# Patient Record
Sex: Female | Born: 1985
Health system: Southern US, Community
[De-identification: ages and names within clinical notes are randomized; demographics above are authoritative.]

## PROBLEM LIST (undated history)

## (undated) DIAGNOSIS — F209 Schizophrenia, unspecified: Secondary | ICD-10-CM

## (undated) DIAGNOSIS — E119 Type 2 diabetes mellitus without complications: Secondary | ICD-10-CM

## (undated) DIAGNOSIS — G4733 Obstructive sleep apnea (adult) (pediatric): Secondary | ICD-10-CM

## (undated) DIAGNOSIS — D582 Other hemoglobinopathies: Secondary | ICD-10-CM

## (undated) DIAGNOSIS — R000 Tachycardia, unspecified: Secondary | ICD-10-CM

## (undated) DIAGNOSIS — G4730 Sleep apnea, unspecified: Secondary | ICD-10-CM

## (undated) DIAGNOSIS — E669 Obesity, unspecified: Secondary | ICD-10-CM

## (undated) DIAGNOSIS — E559 Vitamin D deficiency, unspecified: Secondary | ICD-10-CM

## (undated) DIAGNOSIS — I10 Essential (primary) hypertension: Secondary | ICD-10-CM

## (undated) HISTORY — DX: Schizophrenia, unspecified: F20.9

## (undated) HISTORY — DX: Vitamin D deficiency, unspecified: E55.9

## (undated) HISTORY — DX: Other hemoglobinopathies: D58.2

## (undated) HISTORY — DX: Obstructive sleep apnea (adult) (pediatric): G47.33

## (undated) HISTORY — DX: Tachycardia, unspecified: R00.0

## (undated) HISTORY — DX: Obesity, unspecified: E66.9

## (undated) HISTORY — DX: Type 2 diabetes mellitus without complications: E11.9

---
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
## 2004-06-30 ENCOUNTER — Other Ambulatory Visit: Admission: RE | Admit: 2004-06-30 | Discharge: 2004-06-30 | Payer: Self-pay | Admitting: Gynecology

## 2006-05-06 ENCOUNTER — Other Ambulatory Visit: Admission: RE | Admit: 2006-05-06 | Discharge: 2006-05-06 | Payer: Self-pay | Admitting: Family Medicine

## 2011-03-17 ENCOUNTER — Other Ambulatory Visit (HOSPITAL_COMMUNITY)
Admission: RE | Admit: 2011-03-17 | Discharge: 2011-03-17 | Disposition: A | Discharge status: Home / Self Care | Payer: BC Managed Care – PPO | Source: Ambulatory Visit | Attending: Family Medicine | Admitting: Family Medicine

## 2011-03-17 ENCOUNTER — Other Ambulatory Visit: Payer: Self-pay | Admitting: Family Medicine

## 2011-03-17 DIAGNOSIS — Z0000 Encounter for general adult medical examination without abnormal findings: Secondary | ICD-10-CM | POA: Insufficient documentation

## 2014-02-23 ENCOUNTER — Other Ambulatory Visit (HOSPITAL_COMMUNITY)
Admission: RE | Admit: 2014-02-23 | Discharge: 2014-02-23 | Disposition: A | Discharge status: Home / Self Care | Payer: BC Managed Care – PPO | Source: Ambulatory Visit | Attending: Family Medicine | Admitting: Family Medicine

## 2014-02-23 ENCOUNTER — Other Ambulatory Visit: Payer: Self-pay | Admitting: Family Medicine

## 2014-02-23 DIAGNOSIS — Z124 Encounter for screening for malignant neoplasm of cervix: Secondary | ICD-10-CM | POA: Diagnosis present

## 2014-02-23 DIAGNOSIS — Z1151 Encounter for screening for human papillomavirus (HPV): Secondary | ICD-10-CM | POA: Diagnosis present

## 2014-02-27 LAB — CYTOLOGY - PAP

## 2015-04-30 ENCOUNTER — Ambulatory Visit (INDEPENDENT_AMBULATORY_CARE_PROVIDER_SITE_OTHER): Payer: BLUE CROSS/BLUE SHIELD

## 2015-04-30 ENCOUNTER — Ambulatory Visit (INDEPENDENT_AMBULATORY_CARE_PROVIDER_SITE_OTHER): Payer: BLUE CROSS/BLUE SHIELD | Admitting: Physician Assistant

## 2015-04-30 ENCOUNTER — Telehealth: Payer: Self-pay | Admitting: *Deleted

## 2015-04-30 ENCOUNTER — Encounter: Payer: Self-pay | Admitting: Physician Assistant

## 2015-04-30 VITALS — BP 116/88 | HR 139 | Temp 97.7°F | Resp 16 | Wt 240.8 lb

## 2015-04-30 DIAGNOSIS — M25561 Pain in right knee: Secondary | ICD-10-CM

## 2015-04-30 DIAGNOSIS — R000 Tachycardia, unspecified: Secondary | ICD-10-CM

## 2015-04-30 LAB — POCT CBC
Granulocyte percent: 60.6 %G (ref 37–80)
HEMATOCRIT: 29.8 % — AB (ref 37.7–47.9)
Hemoglobin: 10.1 g/dL — AB (ref 12.2–16.2)
LYMPH, POC: 3.5 — AB (ref 0.6–3.4)
MCH, POC: 26 pg — AB (ref 27–31.2)
MCHC: 33.9 g/dL (ref 31.8–35.4)
MCV: 76.7 fL — AB (ref 80–97)
MID (CBC): 0.5 (ref 0–0.9)
MPV: 7 fL (ref 0–99.8)
POC GRANULOCYTE: 6.1 (ref 2–6.9)
POC LYMPH %: 34.5 % (ref 10–50)
POC MID %: 4.9 % (ref 0–12)
Platelet Count, POC: 277 10*3/uL (ref 142–424)
RBC: 3.89 M/uL — AB (ref 4.04–5.48)
RDW, POC: 18.8 %
WBC: 10 10*3/uL (ref 4.6–10.2)

## 2015-04-30 LAB — D-DIMER, QUANTITATIVE (NOT AT ARMC): D DIMER QUANT: 0.37 ug{FEU}/mL (ref 0.00–0.48)

## 2015-04-30 MED ORDER — IBUPROFEN 600 MG PO TABS: 600.0000 mg | ORAL_TABLET | Freq: Three times a day (TID) | ORAL | Status: AC | PRN

## 2015-04-30 NOTE — Telephone Encounter (Signed)
Stat Report: D-Dimer-0.37

## 2015-04-30 NOTE — Progress Notes (Signed)
05/01/2015 at 10:39 AM  Chelsey Villa / DOB: [birth year] / MRN: [id number]  The patient  does not have a problem list on file.  SUBJECTIVE  Chelsey Villa is a 29 y.o. female who complains of right sided medail knee pain that started about 1 week ago.  She has just started an exercise regimen and the pain started after initiation. Walking makes the pain worse.  She is not sexually active. She has a history of refractory schizophrenia and takes Clozaril and Lurasidone.  She has a history of tachycardia.  She denies chest pain and SOB.  Tachycardia managed by her PCP Dr. Laurann Montanaynthia White.    She  has no past medical history on file.    Medications reviewed and updated by myself where necessary, and exist elsewhere in the encounter.   Chelsey Villa has No Known Allergies. She  reports that she has never smoked. She does not have any smokeless tobacco history on file. She  has no sexual activity history on file. The patient  has no past surgical history on file.  Her family history is not on file.  Review of Systems  Constitutional: Negative for fever and chills.  Respiratory: Negative for shortness of breath.   Cardiovascular: Negative for chest pain.  Gastrointestinal: Negative for nausea and abdominal pain.  Genitourinary: Negative.   Skin: Negative for rash.  Neurological: Negative for dizziness and headaches.    OBJECTIVE  Her  weight is 240 lb 12.8 oz (109.226 kg). Her oral temperature is 97.7 F (36.5 C). Her blood pressure is 116/88 and her pulse is 139. Her respiration is 16.  The patient's body mass index is unknown because there is no height on file.  Physical Exam  Constitutional: She is oriented to person, place, and time. She appears well-developed and well-nourished. No distress.  Eyes: Pupils are equal, round, and reactive to light.  Cardiovascular: Normal rate and regular rhythm.   Respiratory: Effort normal and breath sounds normal. No respiratory distress. She  has no wheezes. She exhibits no tenderness.  GI: Soft. Bowel sounds are normal. She exhibits no distension.  Musculoskeletal: Normal range of motion.       Right knee: She exhibits normal range of motion, no swelling, no effusion, no ecchymosis, no deformity, no erythema, normal alignment, no LCL laxity, normal patellar mobility, no bony tenderness, normal meniscus and no MCL laxity. Tenderness found. Medial joint line tenderness noted. No lateral joint line, no MCL, no LCL and no patellar tendon tenderness noted.       Left knee: Normal.  Neurological: She is alert and oriented to person, place, and time.  Skin: Skin is warm and dry. She is not diaphoretic.  Psychiatric: She has a normal mood and affect. Her behavior is normal. Judgment and thought content normal.    Results for orders placed or performed in visit on 04/30/15 (from the past 24 hour(s))  POCT CBC     Status: Abnormal   Collection Time: 04/30/15  3:14 PM  Result Value Ref Range   WBC 10.0 4.6 - 10.2 K/uL   Lymph, poc 3.5 (A) 0.6 - 3.4   POC LYMPH PERCENT 34.5 10 - 50 %L   MID (cbc) 0.5 0 - 0.9   POC MID % 4.9 0 - 12 %M   POC Granulocyte 6.1 2 - 6.9   Granulocyte percent 60.6 37 - 80 %G   RBC 3.89 (A) 4.04 - 5.48 M/uL   Hemoglobin 10.1 (A) 12.2 - 16.2 g/dL  HCT, POC 29.8 (A) 37.7 - 47.9 %   MCV 76.7 (A) 80 - 97 fL   MCH, POC 26.0 (A) 27 - 31.2 pg   MCHC 33.9 31.8 - 35.4 g/dL   RDW, POC 16.1 %   Platelet Count, POC 277 142 - 424 K/uL   MPV 7.0 0 - 99.8 fL  D-dimer, quantitative (not at [facility])     Status: None   Collection Time: 04/30/15  3:41 PM  Result Value Ref Range   D-Dimer, Quant 0.37 0.00 - 0.48 ug/mL-FEU   Narrative   Performed at:  [facility]                [address]                [place], [place] [address]    [facility] reading (PRIMARY) by PA Chestine Spore: Negative. Please comment.    ASSESSMENT & PLAN  Chelsey Villa was seen today for knee pain.  Diagnoses and all orders for this  visit:  Right medial knee pain: Most likely acute.  Radiographs reassuring from bony standpoint.  Advised that she hold weight bearing exercise for 1 week. Ibuprofen 600 mg q8 as needed for pain.   -     DG Knee Complete 4 Views Right; Future -     POCT CBC  Tachycardia: I spoke with Dr. Cliffton Asters, the patient's PCP via phone and Dr. Cliffton Asters reports that the patient has a history of this problem and it has been worked up including normal TSH, EKG, and CMET.  She did wish to have a D-dimer drawn.  Patient is scheduled to see Dr. Cliffton Asters on October 27th at 12:30 for further workup of this problem.   -     POCT CBC -     Cancel: TSH -     Cancel: COMPLETE METABOLIC PANEL WITH GFR -     Cancel: EKG 12-Lead -     D-dimer, quantitative (not at [facility])    The patient was advised to call or come back to clinic if she does not see an improvement in symptoms, or worsens with the above plan.   Deliah Boston, MHS, PA-C [facility] 05/01/2015 10:39 AM

## 2015-05-01 NOTE — Telephone Encounter (Signed)
Called [facility] and reported stat report nurse for Chelsey Villa.

## 2015-06-12 DIAGNOSIS — G4733 Obstructive sleep apnea (adult) (pediatric): Secondary | ICD-10-CM | POA: Insufficient documentation

## 2015-06-12 DIAGNOSIS — E559 Vitamin D deficiency, unspecified: Secondary | ICD-10-CM | POA: Insufficient documentation

## 2015-06-12 DIAGNOSIS — F209 Schizophrenia, unspecified: Secondary | ICD-10-CM | POA: Insufficient documentation

## 2015-06-12 DIAGNOSIS — E669 Obesity, unspecified: Secondary | ICD-10-CM | POA: Insufficient documentation

## 2015-06-12 DIAGNOSIS — R000 Tachycardia, unspecified: Secondary | ICD-10-CM | POA: Insufficient documentation

## 2015-06-12 DIAGNOSIS — D582 Other hemoglobinopathies: Secondary | ICD-10-CM | POA: Insufficient documentation

## 2015-06-12 DIAGNOSIS — E119 Type 2 diabetes mellitus without complications: Secondary | ICD-10-CM | POA: Insufficient documentation

## 2015-06-14 ENCOUNTER — Ambulatory Visit (INDEPENDENT_AMBULATORY_CARE_PROVIDER_SITE_OTHER): Payer: BLUE CROSS/BLUE SHIELD | Admitting: Cardiology

## 2015-06-14 ENCOUNTER — Encounter: Payer: Self-pay | Admitting: Cardiology

## 2015-06-14 VITALS — BP 126/84 | HR 105 | Ht 60.0 in | Wt 241.0 lb

## 2015-06-14 DIAGNOSIS — R9431 Abnormal electrocardiogram [ECG] [EKG]: Secondary | ICD-10-CM

## 2015-06-14 DIAGNOSIS — F2089 Other schizophrenia: Secondary | ICD-10-CM

## 2015-06-14 DIAGNOSIS — R000 Tachycardia, unspecified: Secondary | ICD-10-CM

## 2015-06-14 NOTE — Patient Instructions (Signed)
Medication Instructions:  The current medical regimen is effective;  continue present plan and medications.  Testing/Procedures: Your physician has requested that you have an echocardiogram. Echocardiography is a painless test that uses sound waves to create images of your heart. It provides your doctor with information about the size and shape of your heart and how well your heart's chambers and valves are working. This procedure takes approximately one hour. There are no restrictions for this procedure.  Follow-Up: Will be based on results of echo.  If you need a refill on your cardiac medications before your next appointment, please call your pharmacy.  Thank you for choosing [facility]!!    ]

## 2015-06-14 NOTE — Progress Notes (Signed)
Cardiology Office Note   Date:  06/14/2015   ID:  Chelsey Villa, DOB 1985/08/29, MRN [id number]  PCP:  Cala BradfordWHITE,CYNTHIA S, MD  Cardiologist:   Donato SchultzSKAINS, Charene Mccallister, MD       History of Present Illness: Chelsey Villa is a 29 y.o. female who presents for  Evaluation of tachycardia. Has sleep apnea, schizophrenia, obesity, diabetes. She is not on any cardiac medications. In October, she was sent to the urgent care center after her heart rate was 140 bpm. She was being seen for right knee pain. According to office note from Dr. Cliffton AstersWhite she had tachycardia for years. She was sinus tachycardia 110 bpm over a year ago. Had no associated symptoms of chest pain, shortness of breath, syncope, bleeding. She does have chronic mild anemia.   A d-dimer was drawn in the urgent care center and was 0.37, normal. Hemoglobin was 10.1.  TSH 1.5 (02/27/15).  She denies awareness of fast HR and says her normal is 90s-100s at rest and has been this way for a long time (since she was a teenager).  She denies chest pain, palpitations, dyspnea, DOE, PND, orthopnea, LE edema, syncope.  She has never been evaluated by cardiology or had 2D ECHO.  She denies personal hx of thyroid disease.  No Family hx of tachycardia or thyroid ds.   Her mom says patient has anemia due to Hgb C disease.  Past Medical History  Diagnosis Date  . Schizophrenia (HCC)   . Obesity   . Hemoglobin C (Hb-C) (HCC)   . Vitamin D deficiency   . OSA (obstructive sleep apnea)   . Diabetes mellitus without complication (HCC)   . Tachycardia     Past Surgical History  Procedure Laterality Date  . Wisdomteeth  2003     Current Outpatient Prescriptions  Medication Sig Dispense Refill  . Cholecalciferol (VITAMIN D3) 2000 UNITS TABS Take by mouth.    . clonazePAM (KLONOPIN) 0.5 MG tablet Take 0.5 mg by mouth 2 (two) times daily as needed for anxiety.    . cloZAPine (CLOZARIL) 100 MG tablet Take 100 mg by mouth daily.    Marland Kitchen. ibuprofen  (ADVIL,MOTRIN) 600 MG tablet Take 1 tablet (600 mg total) by mouth every 8 (eight) hours as needed. 30 tablet 1  . LATUDA 20 MG TABS tablet Take 10 mg by mouth 2 (two) times daily.  1  . LATUDA 60 MG TABS Take 1 tablet by mouth daily.  2  . Multiple Vitamin (MULTIVITAMIN) tablet Take 1 tablet by mouth daily.    . naproxen sodium (ANAPROX) 550 MG tablet Take 550 mg by mouth 2 (two) times daily with a meal.    . omega-3 acid ethyl esters (LOVAZA) 1 G capsule Take by mouth 2 (two) times daily.    Marland Kitchen. topiramate (TOPAMAX) 100 MG tablet Take 100 mg by mouth 2 (two) times daily.     No current facility-administered medications for this visit.    Allergies:   Review of patient's allergies indicates no known allergies.    Social History:  The patient  reports that she has never smoked. She does not have any smokeless tobacco history on file.   Family History:  Hgb C disease   ROS:  Please see the history of present illness.   Otherwise, review of systems are positive for none.   All other systems are reviewed and negative.    PHYSICAL EXAM: VS:  BP 126/84 mmHg  Pulse 105  Ht 5' (1.524 m)  Wt 241 lb (109.317 kg)  BMI 47.07 kg/m2  LMP 05/28/2015 , BMI Body mass index is 47.07 kg/(m^2). GEN: Well nourished, well developed, in no acute distress HEENT: normal Neck: no JVD, carotid bruits, or masses Cardiac: tachycardic, regular rate; no murmurs, rubs, or gallops,no edema  Respiratory:  clear to auscultation bilaterally, normal work of breathing GI: soft, nontender, nondistended, + BS MS: no deformity or atrophy Skin: warm and dry, no rash Neuro:  Grossly normal Psych: euthymic mood, restricted affect   EKG:  EKG is ordered today. The ekg ordered today demonstrates sinus tachycardia, 106 bpm.   Recent Labs: 04/30/2015: Hemoglobin 10.1*    Lipid Panel No results found for: CHOL, TRIG, HDL, CHOLHDL, VLDL, LDLCALC, LDLDIRECT    Wt Readings from Last 3 Encounters:  06/14/15 241 lb  (109.317 kg)  04/30/15 240 lb 12.8 oz (109.226 kg)      Other studies Reviewed: Additional studies/ records that were reviewed today include: prior EKG. Review of the above records demonstrates: sinus tachycardia   ASSESSMENT AND PLAN:   29 year old female with on standing history of sinus tachycardia, schizophrenia.  1.  Sinus tachycardia/abnormal EKG - patient present reports tachycardia for many years (90s-100s at rest) and always asymptomatic.  EKG c/w sinus tachycardia.  Exam reassuring.  Mild anemia (reports Hgb C disease), normal TSH.  Could be ADR of Latuda but she should continue this med for now while investigating other causes.  This may be her normal HR and increased rate in the setting of knee pain.  Will first obtain 2D ECHO to assess structure, function.    Current medicines are reviewed at length with the patient today.  The patient does not have concerns regarding medicines.  The following changes have been made:  no change  Labs/ tests ordered today include: 2D ECHO  Orders Placed This Encounter  Procedures  . EKG 12-Lead  . Echocardiogram     Disposition:   Will determine FU interval after we get results of ECHO.  Mathews Robinsons, MD  06/14/2015 5:14 PM    [facility] [address] [place], [place], [place]  [address] Phone: [phone]; Fax: [phone]

## 2015-06-20 ENCOUNTER — Encounter: Payer: Self-pay | Admitting: Cardiology

## 2015-07-02 ENCOUNTER — Other Ambulatory Visit: Payer: Self-pay

## 2015-07-02 ENCOUNTER — Ambulatory Visit (HOSPITAL_COMMUNITY): Payer: BLUE CROSS/BLUE SHIELD | Attending: Internal Medicine

## 2015-07-02 DIAGNOSIS — R9431 Abnormal electrocardiogram [ECG] [EKG]: Secondary | ICD-10-CM

## 2015-07-02 DIAGNOSIS — E119 Type 2 diabetes mellitus without complications: Secondary | ICD-10-CM | POA: Insufficient documentation

## 2015-07-02 DIAGNOSIS — I517 Cardiomegaly: Secondary | ICD-10-CM | POA: Diagnosis not present

## 2015-07-02 DIAGNOSIS — Z6841 Body Mass Index (BMI) 40.0 and over, adult: Secondary | ICD-10-CM | POA: Diagnosis not present

## 2015-07-02 DIAGNOSIS — I340 Nonrheumatic mitral (valve) insufficiency: Secondary | ICD-10-CM | POA: Diagnosis not present

## 2015-10-17 DIAGNOSIS — R000 Tachycardia, unspecified: Secondary | ICD-10-CM | POA: Diagnosis not present

## 2015-10-17 DIAGNOSIS — N946 Dysmenorrhea, unspecified: Secondary | ICD-10-CM | POA: Diagnosis not present

## 2015-10-17 DIAGNOSIS — E119 Type 2 diabetes mellitus without complications: Secondary | ICD-10-CM | POA: Diagnosis not present

## 2015-11-20 DIAGNOSIS — F200 Paranoid schizophrenia: Secondary | ICD-10-CM | POA: Diagnosis not present

## 2015-12-08 IMAGING — CR DG KNEE COMPLETE 4+V*R*
3 series · 3 of 3 positions shown · non-contrast
Comparison: None.

CLINICAL DATA: Right knee pain for 1 week, no history of trauma

EXAM:
RIGHT KNEE - COMPLETE 4+ VIEW

[lateral]
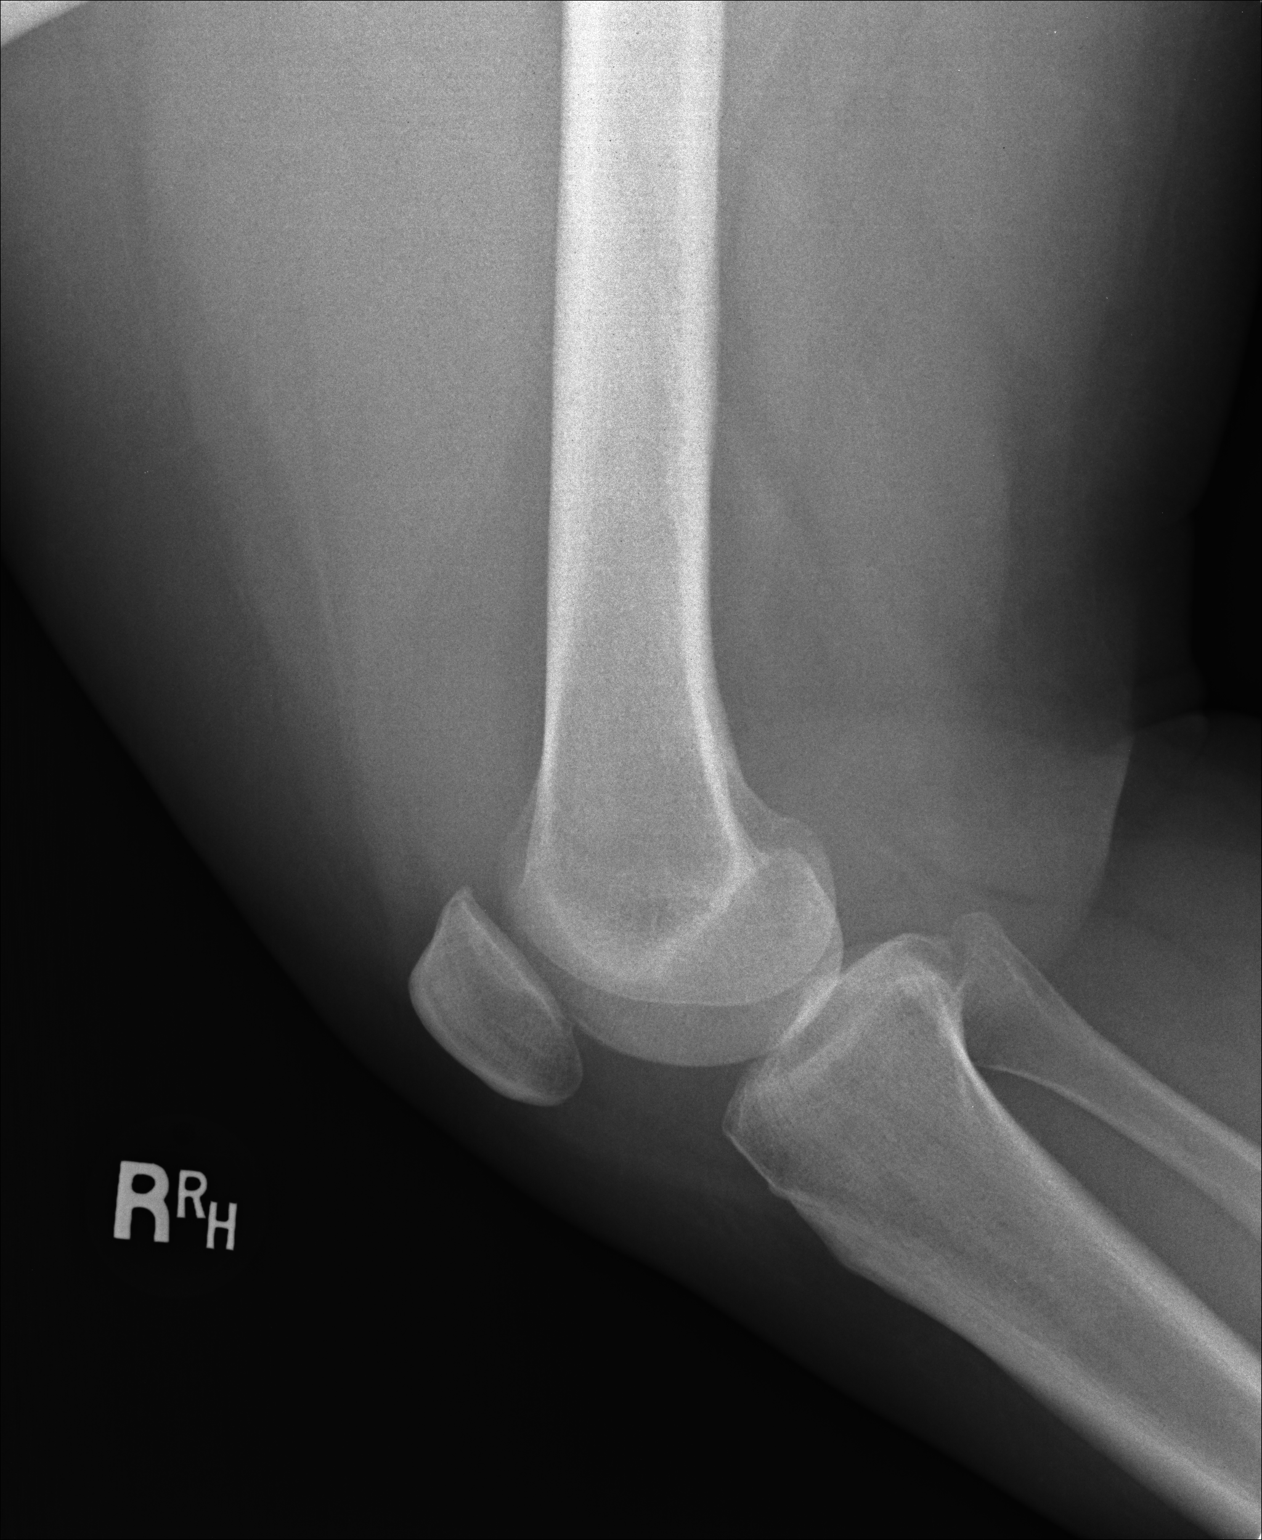

[AP]
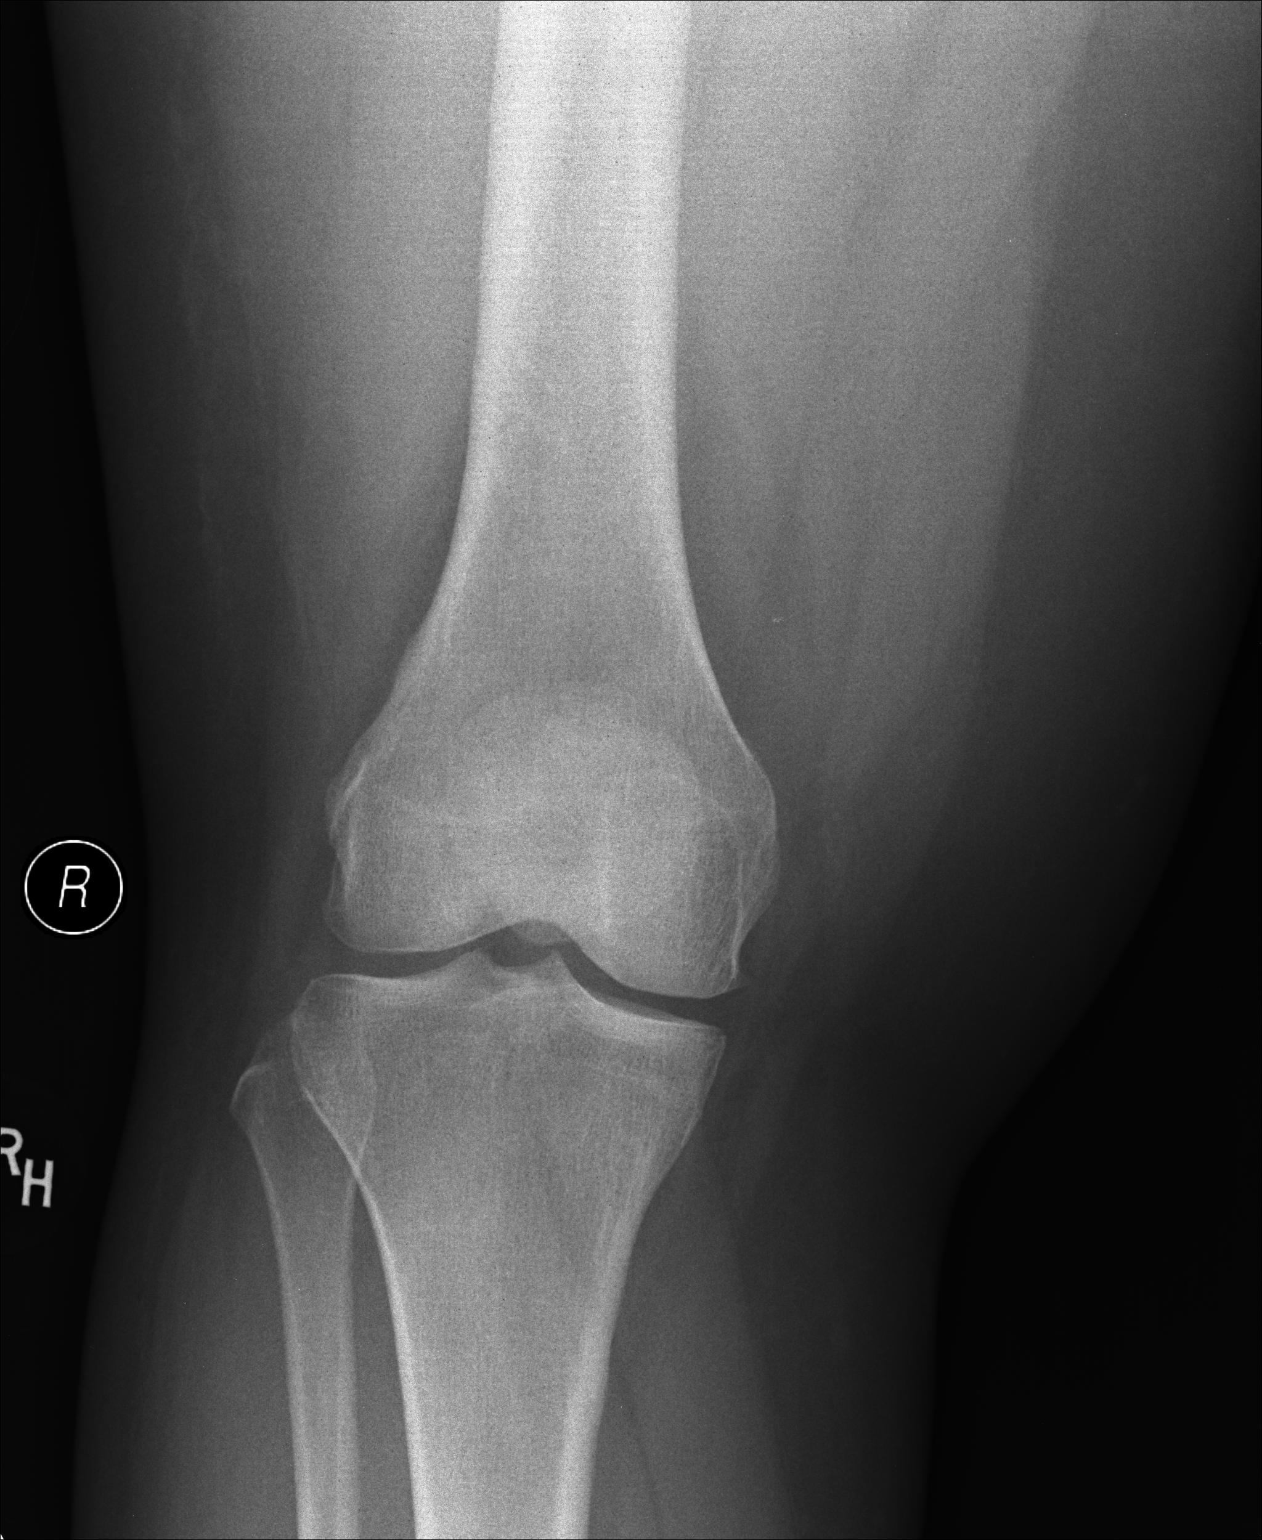

[other]
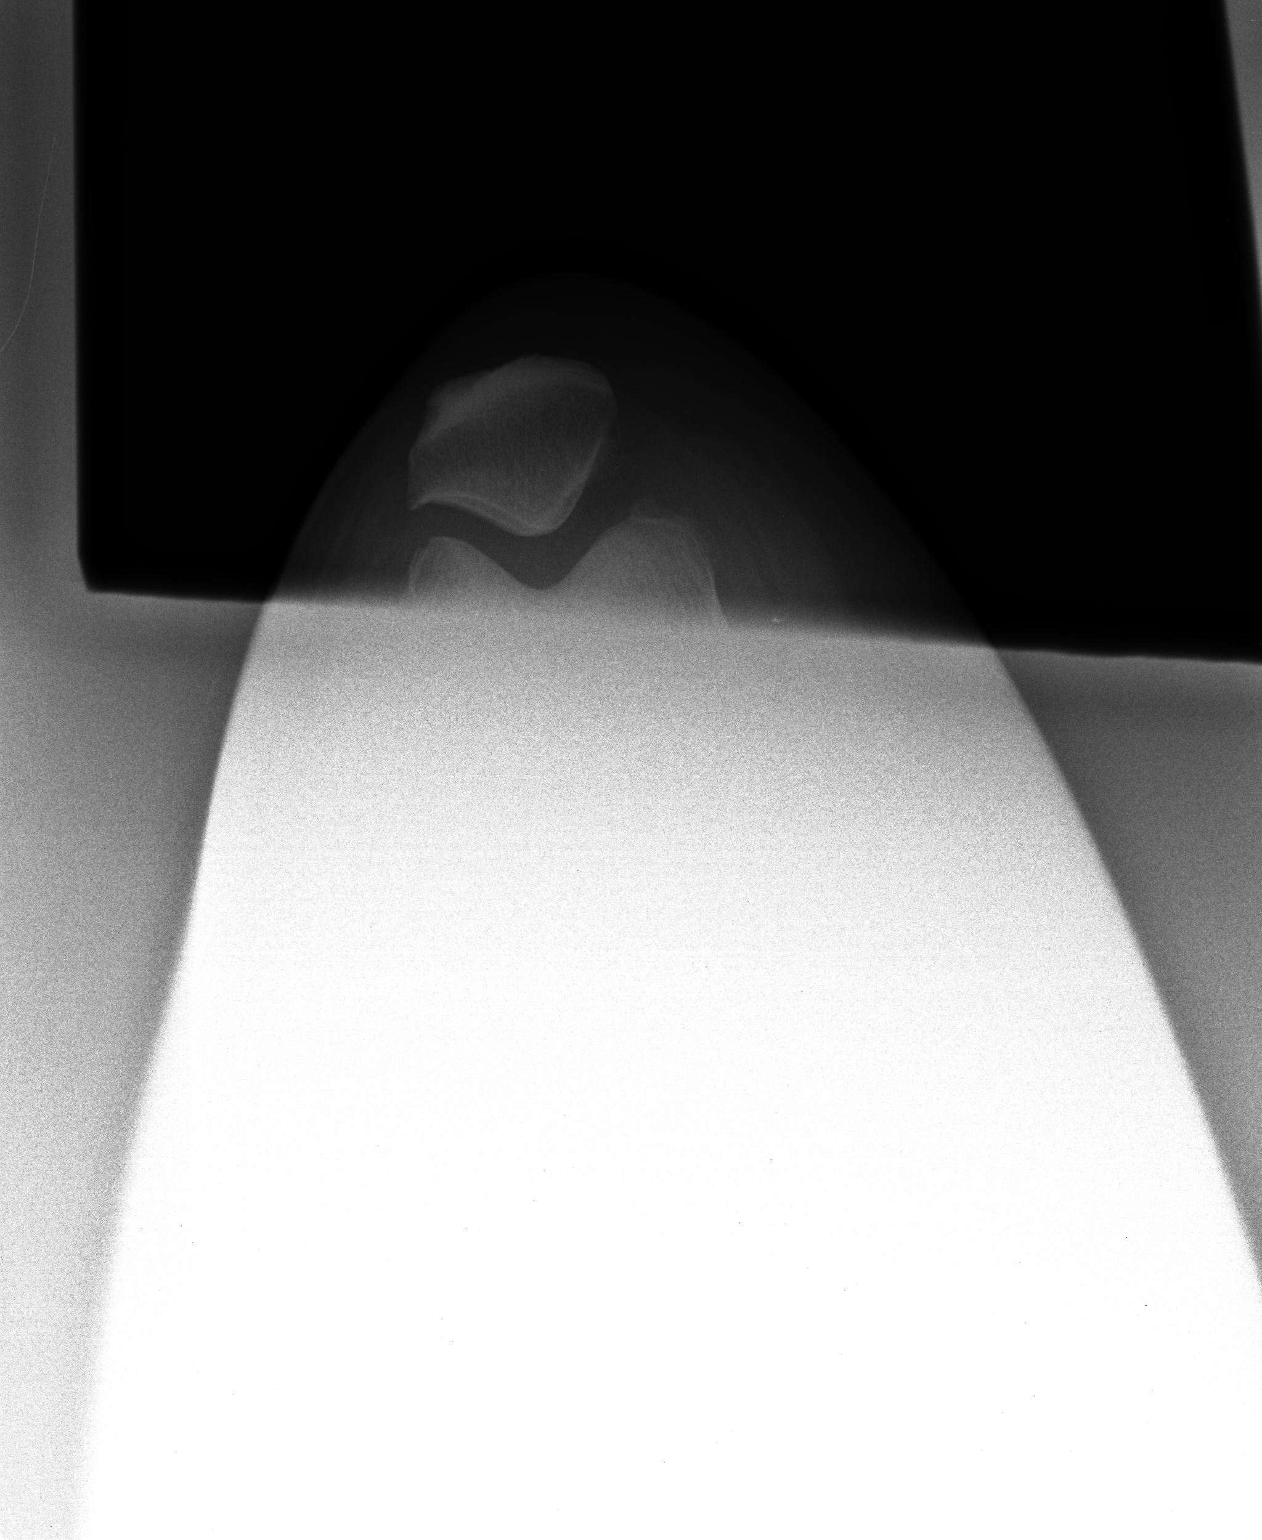

[3 of 3 positions shown; findings below may reference images not displayed]

FINDINGS: The knee joint spaces appear relatively normal for age. However on
the lateral view there is suspicion of a right knee joint effusion.
The patella appears to be normally positioned.
IMPRESSION: Suspect right knee joint effusion.  No fracture.

## 2015-12-30 DIAGNOSIS — F200 Paranoid schizophrenia: Secondary | ICD-10-CM | POA: Diagnosis not present

## 2016-01-28 DIAGNOSIS — F200 Paranoid schizophrenia: Secondary | ICD-10-CM | POA: Diagnosis not present

## 2016-02-18 DIAGNOSIS — F200 Paranoid schizophrenia: Secondary | ICD-10-CM | POA: Diagnosis not present

## 2016-03-27 DIAGNOSIS — F200 Paranoid schizophrenia: Secondary | ICD-10-CM | POA: Diagnosis not present

## 2016-05-04 DIAGNOSIS — D582 Other hemoglobinopathies: Secondary | ICD-10-CM | POA: Diagnosis not present

## 2016-05-04 DIAGNOSIS — Z0000 Encounter for general adult medical examination without abnormal findings: Secondary | ICD-10-CM | POA: Diagnosis not present

## 2016-05-04 DIAGNOSIS — E559 Vitamin D deficiency, unspecified: Secondary | ICD-10-CM | POA: Diagnosis not present

## 2016-05-04 DIAGNOSIS — E119 Type 2 diabetes mellitus without complications: Secondary | ICD-10-CM | POA: Diagnosis not present

## 2016-05-04 DIAGNOSIS — Z23 Encounter for immunization: Secondary | ICD-10-CM | POA: Diagnosis not present

## 2016-05-04 DIAGNOSIS — F200 Paranoid schizophrenia: Secondary | ICD-10-CM | POA: Diagnosis not present

## 2016-05-11 DIAGNOSIS — F200 Paranoid schizophrenia: Secondary | ICD-10-CM | POA: Diagnosis not present

## 2016-06-23 DIAGNOSIS — F200 Paranoid schizophrenia: Secondary | ICD-10-CM | POA: Diagnosis not present

## 2016-07-28 DIAGNOSIS — Z79899 Other long term (current) drug therapy: Secondary | ICD-10-CM | POA: Diagnosis not present

## 2016-07-28 DIAGNOSIS — F200 Paranoid schizophrenia: Secondary | ICD-10-CM | POA: Diagnosis not present

## 2016-07-31 DIAGNOSIS — Z79899 Other long term (current) drug therapy: Secondary | ICD-10-CM | POA: Diagnosis not present

## 2016-09-16 DIAGNOSIS — Z79899 Other long term (current) drug therapy: Secondary | ICD-10-CM | POA: Diagnosis not present

## 2016-10-20 DIAGNOSIS — F200 Paranoid schizophrenia: Secondary | ICD-10-CM | POA: Diagnosis not present

## 2016-10-27 DIAGNOSIS — Z79899 Other long term (current) drug therapy: Secondary | ICD-10-CM | POA: Diagnosis not present

## 2016-12-11 DIAGNOSIS — Z79899 Other long term (current) drug therapy: Secondary | ICD-10-CM | POA: Diagnosis not present

## 2017-01-27 DIAGNOSIS — Z79899 Other long term (current) drug therapy: Secondary | ICD-10-CM | POA: Diagnosis not present

## 2017-03-17 DIAGNOSIS — Z79899 Other long term (current) drug therapy: Secondary | ICD-10-CM | POA: Diagnosis not present

## 2017-03-29 DIAGNOSIS — F200 Paranoid schizophrenia: Secondary | ICD-10-CM | POA: Diagnosis not present

## 2017-04-27 DIAGNOSIS — Z79899 Other long term (current) drug therapy: Secondary | ICD-10-CM | POA: Diagnosis not present

## 2017-05-11 DIAGNOSIS — Z0000 Encounter for general adult medical examination without abnormal findings: Secondary | ICD-10-CM | POA: Diagnosis not present

## 2017-05-11 DIAGNOSIS — E119 Type 2 diabetes mellitus without complications: Secondary | ICD-10-CM | POA: Diagnosis not present

## 2017-05-11 DIAGNOSIS — E559 Vitamin D deficiency, unspecified: Secondary | ICD-10-CM | POA: Diagnosis not present

## 2017-05-19 DIAGNOSIS — R740 Nonspecific elevation of levels of transaminase and lactic acid dehydrogenase [LDH]: Secondary | ICD-10-CM | POA: Diagnosis not present

## 2017-06-09 DIAGNOSIS — Z79899 Other long term (current) drug therapy: Secondary | ICD-10-CM | POA: Diagnosis not present

## 2017-07-20 DIAGNOSIS — Z79899 Other long term (current) drug therapy: Secondary | ICD-10-CM | POA: Diagnosis not present

## 2017-08-05 DIAGNOSIS — F200 Paranoid schizophrenia: Secondary | ICD-10-CM | POA: Diagnosis not present

## 2017-08-31 DIAGNOSIS — Z79899 Other long term (current) drug therapy: Secondary | ICD-10-CM | POA: Diagnosis not present

## 2017-10-12 DIAGNOSIS — Z79899 Other long term (current) drug therapy: Secondary | ICD-10-CM | POA: Diagnosis not present

## 2017-10-28 DIAGNOSIS — F200 Paranoid schizophrenia: Secondary | ICD-10-CM | POA: Diagnosis not present

## 2017-11-23 DIAGNOSIS — Z79899 Other long term (current) drug therapy: Secondary | ICD-10-CM | POA: Diagnosis not present

## 2018-01-03 DIAGNOSIS — Z79899 Other long term (current) drug therapy: Secondary | ICD-10-CM | POA: Diagnosis not present

## 2018-01-20 DIAGNOSIS — R030 Elevated blood-pressure reading, without diagnosis of hypertension: Secondary | ICD-10-CM | POA: Diagnosis not present

## 2018-02-02 DIAGNOSIS — Z79899 Other long term (current) drug therapy: Secondary | ICD-10-CM | POA: Diagnosis not present

## 2018-02-15 DIAGNOSIS — Z79899 Other long term (current) drug therapy: Secondary | ICD-10-CM | POA: Diagnosis not present

## 2018-04-04 DIAGNOSIS — Z79899 Other long term (current) drug therapy: Secondary | ICD-10-CM | POA: Diagnosis not present

## 2018-04-27 DIAGNOSIS — F200 Paranoid schizophrenia: Secondary | ICD-10-CM | POA: Diagnosis not present

## 2018-04-27 DIAGNOSIS — Z79899 Other long term (current) drug therapy: Secondary | ICD-10-CM | POA: Diagnosis not present

## 2018-05-17 DIAGNOSIS — F209 Schizophrenia, unspecified: Secondary | ICD-10-CM | POA: Diagnosis not present

## 2018-05-17 DIAGNOSIS — Z0000 Encounter for general adult medical examination without abnormal findings: Secondary | ICD-10-CM | POA: Diagnosis not present

## 2018-05-17 DIAGNOSIS — E559 Vitamin D deficiency, unspecified: Secondary | ICD-10-CM | POA: Diagnosis not present

## 2018-05-17 DIAGNOSIS — E119 Type 2 diabetes mellitus without complications: Secondary | ICD-10-CM | POA: Diagnosis not present

## 2018-05-17 DIAGNOSIS — Z79899 Other long term (current) drug therapy: Secondary | ICD-10-CM | POA: Diagnosis not present

## 2018-05-17 DIAGNOSIS — D582 Other hemoglobinopathies: Secondary | ICD-10-CM | POA: Diagnosis not present

## 2018-07-01 DIAGNOSIS — Z79899 Other long term (current) drug therapy: Secondary | ICD-10-CM | POA: Diagnosis not present

## 2018-07-26 DIAGNOSIS — F200 Paranoid schizophrenia: Secondary | ICD-10-CM | POA: Diagnosis not present

## 2018-07-26 DIAGNOSIS — Z79899 Other long term (current) drug therapy: Secondary | ICD-10-CM | POA: Diagnosis not present

## 2018-08-11 DIAGNOSIS — Z79899 Other long term (current) drug therapy: Secondary | ICD-10-CM | POA: Diagnosis not present

## 2018-09-19 DIAGNOSIS — G4733 Obstructive sleep apnea (adult) (pediatric): Secondary | ICD-10-CM | POA: Diagnosis not present

## 2018-09-22 DIAGNOSIS — G4733 Obstructive sleep apnea (adult) (pediatric): Secondary | ICD-10-CM | POA: Diagnosis not present

## 2018-10-26 DIAGNOSIS — F200 Paranoid schizophrenia: Secondary | ICD-10-CM | POA: Diagnosis not present

## 2018-10-26 DIAGNOSIS — Z79899 Other long term (current) drug therapy: Secondary | ICD-10-CM | POA: Diagnosis not present

## 2018-11-01 DIAGNOSIS — Z79899 Other long term (current) drug therapy: Secondary | ICD-10-CM | POA: Diagnosis not present

## 2018-11-28 DIAGNOSIS — Z713 Dietary counseling and surveillance: Secondary | ICD-10-CM | POA: Diagnosis not present

## 2018-12-14 DIAGNOSIS — Z79899 Other long term (current) drug therapy: Secondary | ICD-10-CM | POA: Diagnosis not present

## 2019-01-25 DIAGNOSIS — Z79899 Other long term (current) drug therapy: Secondary | ICD-10-CM | POA: Diagnosis not present

## 2019-01-30 DIAGNOSIS — F200 Paranoid schizophrenia: Secondary | ICD-10-CM | POA: Diagnosis not present

## 2019-01-30 DIAGNOSIS — Z79899 Other long term (current) drug therapy: Secondary | ICD-10-CM | POA: Diagnosis not present

## 2019-03-14 DIAGNOSIS — Z79899 Other long term (current) drug therapy: Secondary | ICD-10-CM | POA: Diagnosis not present

## 2019-04-25 DIAGNOSIS — Z79899 Other long term (current) drug therapy: Secondary | ICD-10-CM | POA: Diagnosis not present

## 2019-04-25 DIAGNOSIS — F200 Paranoid schizophrenia: Secondary | ICD-10-CM | POA: Diagnosis not present

## 2019-04-26 DIAGNOSIS — Z79899 Other long term (current) drug therapy: Secondary | ICD-10-CM | POA: Diagnosis not present

## 2019-05-19 ENCOUNTER — Other Ambulatory Visit: Payer: Self-pay | Admitting: Family Medicine

## 2019-05-19 ENCOUNTER — Other Ambulatory Visit (HOSPITAL_COMMUNITY)
Admission: RE | Admit: 2019-05-19 | Discharge: 2019-05-19 | Disposition: A | Discharge status: Home / Self Care | Payer: BC Managed Care – PPO | Source: Ambulatory Visit | Attending: Family Medicine | Admitting: Family Medicine

## 2019-05-19 DIAGNOSIS — Z0000 Encounter for general adult medical examination without abnormal findings: Secondary | ICD-10-CM | POA: Diagnosis not present

## 2019-05-19 DIAGNOSIS — Z124 Encounter for screening for malignant neoplasm of cervix: Secondary | ICD-10-CM | POA: Insufficient documentation

## 2019-05-19 DIAGNOSIS — E559 Vitamin D deficiency, unspecified: Secondary | ICD-10-CM | POA: Diagnosis not present

## 2019-05-19 DIAGNOSIS — E119 Type 2 diabetes mellitus without complications: Secondary | ICD-10-CM | POA: Diagnosis not present

## 2019-05-19 DIAGNOSIS — D582 Other hemoglobinopathies: Secondary | ICD-10-CM | POA: Diagnosis not present

## 2019-05-24 LAB — CYTOLOGY - PAP
Comment: NEGATIVE
Diagnosis: NEGATIVE
High risk HPV: POSITIVE — AB

## 2019-05-31 DIAGNOSIS — Z79899 Other long term (current) drug therapy: Secondary | ICD-10-CM | POA: Diagnosis not present

## 2019-07-12 DIAGNOSIS — Z79899 Other long term (current) drug therapy: Secondary | ICD-10-CM | POA: Diagnosis not present

## 2019-07-26 DIAGNOSIS — F200 Paranoid schizophrenia: Secondary | ICD-10-CM | POA: Diagnosis not present

## 2019-07-26 DIAGNOSIS — Z79899 Other long term (current) drug therapy: Secondary | ICD-10-CM | POA: Diagnosis not present

## 2019-08-24 DIAGNOSIS — Z79899 Other long term (current) drug therapy: Secondary | ICD-10-CM | POA: Diagnosis not present

## 2019-10-04 DIAGNOSIS — Z79899 Other long term (current) drug therapy: Secondary | ICD-10-CM | POA: Diagnosis not present

## 2019-10-23 DIAGNOSIS — F200 Paranoid schizophrenia: Secondary | ICD-10-CM | POA: Diagnosis not present

## 2019-10-23 DIAGNOSIS — Z79899 Other long term (current) drug therapy: Secondary | ICD-10-CM | POA: Diagnosis not present

## 2019-10-25 DIAGNOSIS — Z79899 Other long term (current) drug therapy: Secondary | ICD-10-CM | POA: Diagnosis not present

## 2019-11-15 DIAGNOSIS — Z79899 Other long term (current) drug therapy: Secondary | ICD-10-CM | POA: Diagnosis not present

## 2019-11-29 DIAGNOSIS — R030 Elevated blood-pressure reading, without diagnosis of hypertension: Secondary | ICD-10-CM | POA: Diagnosis not present

## 2019-12-27 DIAGNOSIS — Z79899 Other long term (current) drug therapy: Secondary | ICD-10-CM | POA: Diagnosis not present

## 2020-01-10 DIAGNOSIS — F200 Paranoid schizophrenia: Secondary | ICD-10-CM | POA: Diagnosis not present

## 2020-01-10 DIAGNOSIS — Z79899 Other long term (current) drug therapy: Secondary | ICD-10-CM | POA: Diagnosis not present

## 2020-02-06 DIAGNOSIS — Z79899 Other long term (current) drug therapy: Secondary | ICD-10-CM | POA: Diagnosis not present

## 2020-03-04 DIAGNOSIS — R030 Elevated blood-pressure reading, without diagnosis of hypertension: Secondary | ICD-10-CM | POA: Diagnosis not present

## 2020-03-04 DIAGNOSIS — E119 Type 2 diabetes mellitus without complications: Secondary | ICD-10-CM | POA: Diagnosis not present

## 2020-03-20 DIAGNOSIS — Z79899 Other long term (current) drug therapy: Secondary | ICD-10-CM | POA: Diagnosis not present

## 2020-04-03 DIAGNOSIS — Z79899 Other long term (current) drug therapy: Secondary | ICD-10-CM | POA: Diagnosis not present

## 2020-04-03 DIAGNOSIS — F200 Paranoid schizophrenia: Secondary | ICD-10-CM | POA: Diagnosis not present

## 2020-05-01 DIAGNOSIS — Z79899 Other long term (current) drug therapy: Secondary | ICD-10-CM | POA: Diagnosis not present

## 2020-05-21 DIAGNOSIS — Z0000 Encounter for general adult medical examination without abnormal findings: Secondary | ICD-10-CM | POA: Diagnosis not present

## 2020-05-21 DIAGNOSIS — E559 Vitamin D deficiency, unspecified: Secondary | ICD-10-CM | POA: Diagnosis not present

## 2020-05-21 DIAGNOSIS — E119 Type 2 diabetes mellitus without complications: Secondary | ICD-10-CM | POA: Diagnosis not present

## 2020-05-21 DIAGNOSIS — D582 Other hemoglobinopathies: Secondary | ICD-10-CM | POA: Diagnosis not present

## 2020-06-13 DIAGNOSIS — Z79899 Other long term (current) drug therapy: Secondary | ICD-10-CM | POA: Diagnosis not present

## 2020-06-19 DIAGNOSIS — F200 Paranoid schizophrenia: Secondary | ICD-10-CM | POA: Diagnosis not present

## 2020-06-19 DIAGNOSIS — Z79899 Other long term (current) drug therapy: Secondary | ICD-10-CM | POA: Diagnosis not present

## 2020-08-06 DIAGNOSIS — Z79899 Other long term (current) drug therapy: Secondary | ICD-10-CM | POA: Diagnosis not present

## 2020-08-28 DIAGNOSIS — I471 Supraventricular tachycardia: Secondary | ICD-10-CM | POA: Diagnosis not present

## 2020-08-28 DIAGNOSIS — R030 Elevated blood-pressure reading, without diagnosis of hypertension: Secondary | ICD-10-CM | POA: Diagnosis not present

## 2020-09-11 DIAGNOSIS — Z79899 Other long term (current) drug therapy: Secondary | ICD-10-CM | POA: Diagnosis not present

## 2020-09-11 DIAGNOSIS — F200 Paranoid schizophrenia: Secondary | ICD-10-CM | POA: Diagnosis not present

## 2020-10-21 DIAGNOSIS — Z79899 Other long term (current) drug therapy: Secondary | ICD-10-CM | POA: Diagnosis not present

## 2020-11-27 DIAGNOSIS — R030 Elevated blood-pressure reading, without diagnosis of hypertension: Secondary | ICD-10-CM | POA: Diagnosis not present

## 2020-12-03 DIAGNOSIS — Z79899 Other long term (current) drug therapy: Secondary | ICD-10-CM | POA: Diagnosis not present

## 2020-12-11 DIAGNOSIS — Z79899 Other long term (current) drug therapy: Secondary | ICD-10-CM | POA: Diagnosis not present

## 2020-12-11 DIAGNOSIS — F200 Paranoid schizophrenia: Secondary | ICD-10-CM | POA: Diagnosis not present

## 2021-01-14 DIAGNOSIS — Z79899 Other long term (current) drug therapy: Secondary | ICD-10-CM | POA: Diagnosis not present

## 2021-02-19 DIAGNOSIS — Z79899 Other long term (current) drug therapy: Secondary | ICD-10-CM | POA: Diagnosis not present

## 2021-03-05 DIAGNOSIS — Z79899 Other long term (current) drug therapy: Secondary | ICD-10-CM | POA: Diagnosis not present

## 2021-03-05 DIAGNOSIS — F200 Paranoid schizophrenia: Secondary | ICD-10-CM | POA: Diagnosis not present

## 2021-03-12 DIAGNOSIS — Z20822 Contact with and (suspected) exposure to covid-19: Secondary | ICD-10-CM | POA: Diagnosis not present

## 2021-03-25 DIAGNOSIS — Z79899 Other long term (current) drug therapy: Secondary | ICD-10-CM | POA: Diagnosis not present

## 2021-03-31 DIAGNOSIS — F209 Schizophrenia, unspecified: Secondary | ICD-10-CM | POA: Diagnosis not present

## 2021-03-31 DIAGNOSIS — E119 Type 2 diabetes mellitus without complications: Secondary | ICD-10-CM | POA: Diagnosis not present

## 2021-03-31 DIAGNOSIS — R030 Elevated blood-pressure reading, without diagnosis of hypertension: Secondary | ICD-10-CM | POA: Diagnosis not present

## 2021-04-24 DIAGNOSIS — Z79899 Other long term (current) drug therapy: Secondary | ICD-10-CM | POA: Diagnosis not present

## 2021-05-26 DIAGNOSIS — F209 Schizophrenia, unspecified: Secondary | ICD-10-CM | POA: Diagnosis not present

## 2021-05-26 DIAGNOSIS — R946 Abnormal results of thyroid function studies: Secondary | ICD-10-CM | POA: Diagnosis not present

## 2021-05-26 DIAGNOSIS — L680 Hirsutism: Secondary | ICD-10-CM | POA: Diagnosis not present

## 2021-05-26 DIAGNOSIS — Z0000 Encounter for general adult medical examination without abnormal findings: Secondary | ICD-10-CM | POA: Diagnosis not present

## 2021-05-26 DIAGNOSIS — H6122 Impacted cerumen, left ear: Secondary | ICD-10-CM | POA: Diagnosis not present

## 2021-05-26 DIAGNOSIS — E559 Vitamin D deficiency, unspecified: Secondary | ICD-10-CM | POA: Diagnosis not present

## 2021-05-26 DIAGNOSIS — E119 Type 2 diabetes mellitus without complications: Secondary | ICD-10-CM | POA: Diagnosis not present

## 2021-05-28 DIAGNOSIS — F200 Paranoid schizophrenia: Secondary | ICD-10-CM | POA: Diagnosis not present

## 2021-05-28 DIAGNOSIS — Z79899 Other long term (current) drug therapy: Secondary | ICD-10-CM | POA: Diagnosis not present

## 2021-05-30 DIAGNOSIS — Z79899 Other long term (current) drug therapy: Secondary | ICD-10-CM | POA: Diagnosis not present

## 2021-06-23 ENCOUNTER — Ambulatory Visit
Admission: RE | Admit: 2021-06-23 | Discharge: 2021-06-23 | Disposition: A | Discharge status: Home / Self Care | Payer: BC Managed Care – PPO | Source: Ambulatory Visit | Attending: Emergency Medicine | Admitting: Emergency Medicine

## 2021-06-23 ENCOUNTER — Other Ambulatory Visit: Payer: Self-pay

## 2021-06-23 VITALS — BP 124/84 | HR 122 | Temp 98.9°F | Resp 20

## 2021-06-23 DIAGNOSIS — Q808 Other congenital ichthyosis: Secondary | ICD-10-CM | POA: Diagnosis not present

## 2021-06-23 MED ORDER — TRIAMCINOLONE ACETONIDE 0.025 % EX CREA: TOPICAL_CREAM | CUTANEOUS | 0 refills | Status: AC

## 2021-06-23 NOTE — ED Triage Notes (Signed)
Pt c/o bilateral hand peeling that started last Tuesday. Patient denies any changes to soaps or lotions.

## 2021-06-23 NOTE — Discharge Instructions (Addendum)
Apply triamcinolone cream to affected areas twice daily, rub it until completely absorbed, apply Eucerin cream after triamcinolone is absorbed.  You can continue to apply Eucerin cream multiple times throughout the day.  Do not use triamcinolone cream more than twice daily.  Please follow-up with your primary care provider if you have not seen significant improvement of your symptoms.

## 2021-06-23 NOTE — ED Provider Notes (Signed)
[facility]    CSN: [id number] Arrival date & time: 06/23/21  1411    HISTORY  No chief complaint on file.  HPI Chelsey Villa is a 35 y.o. female. Pt c/o bilateral hand peeling that started last Tuesday. Patient denies any changes to soaps or lotions.  Patient states he is never had this in the past.  Patient states her hands have not been red or itchy.  Patient states she has been using a lot of alcohol hand sanitizer.  Patient states she is not tried anything other than lotion for this issue.  The history is provided by the patient.  Past Medical History:  Diagnosis Date   Diabetes mellitus without complication (HCC)    Hemoglobin C (Hb-C) (HCC)    Obesity    OSA (obstructive sleep apnea)    Schizophrenia (HCC)    Tachycardia    Vitamin D deficiency    Patient Active Problem List   Diagnosis Date Noted   Schizophrenia (HCC)    Obesity    Hemoglobin C (Hb-C) (HCC)    Vitamin D deficiency    OSA (obstructive sleep apnea)    Diabetes mellitus without complication (HCC)    Tachycardia    Past Surgical History:  Procedure Laterality Date   wisdomteeth  2003   OB History   No obstetric history on file.    Home Medications    Prior to Admission medications   Medication Sig Start Date End Date Taking? Authorizing Provider  Cholecalciferol (VITAMIN D3) 2000 UNITS TABS Take by mouth.    [provider]  clonazePAM (KLONOPIN) 0.5 MG tablet Take 0.5 mg by mouth 2 (two) times daily as needed for anxiety.    [provider]  cloZAPine (CLOZARIL) 100 MG tablet Take 100 mg by mouth daily.    [provider]  Evening Primrose Oil 500 MG CAPS Take by mouth.    [provider]  ibuprofen (ADVIL,MOTRIN) 600 MG tablet Take 1 tablet (600 mg total) by mouth every 8 (eight) hours as needed. 04/30/15   Ofilia Neas, PA-C  LATUDA 20 MG TABS tablet Take 10 mg by mouth 2 (two) times daily. 05/28/15   [provider]  LATUDA  60 MG TABS Take 1 tablet by mouth daily. 05/27/15   [provider]  Multiple Vitamin (MULTIVITAMIN) tablet Take 1 tablet by mouth daily.    [provider]  naproxen sodium (ANAPROX) 550 MG tablet Take 550 mg by mouth 2 (two) times daily with a meal.    [provider]  omega-3 acid ethyl esters (LOVAZA) 1 G capsule Take by mouth 2 (two) times daily.    [provider]  topiramate (TOPAMAX) 100 MG tablet Take 100 mg by mouth 2 (two) times daily.    [provider]    Family History History reviewed. No pertinent family history. Social History Social History   Tobacco Use   Smoking status: Never   Allergies   Patient has no known allergies.  Review of Systems Review of Systems Pertinent findings noted in history of present illness.   Physical Exam Triage Vital Signs ED Triage Vitals  Enc Vitals Group     BP 05/09/21 0827 (!) 147/82     Pulse Rate 05/09/21 0827 72     Resp 05/09/21 0827 18     Temp 05/09/21 0827 98.3 F (36.8 C)     Temp Source 05/09/21 0827 Oral     SpO2 05/09/21 0827 98 %  Weight --      Height --      Head Circumference --      Peak Flow --      Pain Score 05/09/21 0826 5     Pain Loc --      Pain Edu? --      Excl. in GC? --   No data found.  Updated Vital Signs BP 124/84 (BP Location: Right Arm)   Pulse (!) 122   Temp 98.9 F (37.2 C) (Oral)   Resp 20   LMP 06/11/2021 (Approximate)   SpO2 95%   Physical Exam Vitals and nursing note reviewed.  Constitutional:      General: She is not in acute distress.    Appearance: Normal appearance. She is not ill-appearing.  HENT:     Head: Normocephalic and atraumatic.  Eyes:     General: Lids are normal.        Right eye: No discharge.        Left eye: No discharge.     Extraocular Movements: Extraocular movements intact.     Conjunctiva/sclera: Conjunctivae normal.     Right eye: Right conjunctiva is not injected.     Left eye: Left conjunctiva  is not injected.  Neck:     Trachea: Trachea and phonation normal.  Cardiovascular:     Rate and Rhythm: Normal rate and regular rhythm.     Pulses: Normal pulses.     Heart sounds: Normal heart sounds. No murmur heard.   No friction rub. No gallop.  Pulmonary:     Effort: Pulmonary effort is normal. No accessory muscle usage, prolonged expiration or respiratory distress.     Breath sounds: Normal breath sounds. No stridor, decreased air movement or transmitted upper airway sounds. No decreased breath sounds, wheezing, rhonchi or rales.  Chest:     Chest wall: No tenderness.  Musculoskeletal:        General: Normal range of motion.     Cervical back: Normal range of motion and neck supple. Normal range of motion.  Lymphadenopathy:     Cervical: No cervical adenopathy.  Skin:    General: Skin is warm and dry.     Coloration: Skin is not jaundiced or pale.     Findings: No bruising, erythema, lesion or rash.     Comments: Skin on dorsum of both hands is peeling without any signs of excoriation, erythema, trauma.  Neurological:     General: No focal deficit present.     Mental Status: She is alert and oriented to person, place, and time.  Psychiatric:        Mood and Affect: Mood normal.        Behavior: Behavior normal.    Visual Acuity Right Eye Distance:   Left Eye Distance:   Bilateral Distance:    Right Eye Near:   Left Eye Near:    Bilateral Near:     UC Couse / Diagnostics / Procedures:    EKG  Radiology No results found.  Procedures Procedures (including critical care time)  UC Diagnoses / Final Clinical Impressions(s)   I have reviewed the triage vital signs and the nursing notes.  Pertinent labs & imaging results that were available during my care of the patient were reviewed by me and considered in my medical decision making (see chart for details).    Final diagnoses:  Acral type peeling skin syndrome   Patient advised that she has dyshidrotic eczema  versus acral peeling  skin syndrome.  Patient provided with a low-dose topical steroid and advised to combine this with Eucerin original healing cream and apply twice daily to hands, can reapply Eucerin cream as often as she likes.  Patient advised she will likely need to follow-up with dermatology. ED Prescriptions     Medication Sig Dispense Auth. Provider   triamcinolone (KENALOG) 0.025 % cream Apply to affected area twice daily, rub into skin thoroughly, apply barrier cream such as Eucerin after triamcinolone is completely absorbed. 80 g Theadora Rama Scales, PA-C      PDMP not reviewed this encounter.  Pending results:  Labs Reviewed - No data to display  Medications Ordered in UC: Medications - No data to display  Disposition Upon Discharge:  Condition: stable for discharge home Home: take medications as prescribed; routine discharge instructions as discussed; follow up as advised.  Patient presented with an acute illness with associated systemic symptoms and significant discomfort requiring urgent management. In my opinion, this is a condition that a prudent lay person (someone who possesses an average knowledge of health and medicine) may potentially expect to result in complications if not addressed urgently such as respiratory distress, impairment of bodily function or dysfunction of bodily organs.   Routine symptom specific, illness specific and/or disease specific instructions were discussed with the patient and/or caregiver at length.   As such, the patient has been evaluated and assessed, work-up was performed and treatment was provided in alignment with urgent care protocols and evidence based medicine.  Patient/parent/caregiver has been advised that the patient may require follow up for further testing and treatment if the symptoms continue in spite of treatment, as clinically indicated and appropriate.  If the patient was tested for COVID-19, Influenza and/or RSV, then the  patient/parent/guardian was advised to isolate at home pending the results of his/her diagnostic coronavirus test and potentially longer if they're positive. I have also advised pt that if his/her COVID-19 test returns positive, it's recommended to self-isolate for at least 10 days after symptoms first appeared AND until fever-free for 24 hours without fever reducer AND other symptoms have improved or resolved. Discussed self-isolation recommendations as well as instructions for household member/close contacts as per the [facility] and [place] DHHS, and also gave patient the COVID packet with this information.  Patient/parent/caregiver has been advised to return to the [facility] or PCP in 3-5 days if no better; to PCP or the Emergency Department if new signs and symptoms develop, or if the current signs or symptoms continue to change or worsen for further workup, evaluation and treatment as clinically indicated and appropriate  The patient will follow up with their current PCP if and as advised. If the patient does not currently have a PCP we will assist them in obtaining one.   The patient may need specialty follow up if the symptoms continue, in spite of conservative treatment and management, for further workup, evaluation, consultation and treatment as clinically indicated and appropriate.   Patient/parent/caregiver verbalized understanding and agreement of plan as discussed.  All questions were addressed during visit.  Please see discharge instructions below for further details of plan.  Discharge Instructions:   Discharge Instructions      Apply triamcinolone cream to affected areas twice daily, rub it until completely absorbed, apply Eucerin cream after triamcinolone is absorbed.  You can continue to apply Eucerin cream multiple times throughout the day.  Do not use triamcinolone cream more than twice daily.  Please follow-up with your primary care provider if  you have not seen significant improvement of your  symptoms.         Theadora Rama Scales, [place] 06/24/21 [phone]

## 2021-07-01 DIAGNOSIS — Z79899 Other long term (current) drug therapy: Secondary | ICD-10-CM | POA: Diagnosis not present

## 2021-07-02 DIAGNOSIS — R234 Changes in skin texture: Secondary | ICD-10-CM | POA: Diagnosis not present

## 2021-08-05 DIAGNOSIS — Z79899 Other long term (current) drug therapy: Secondary | ICD-10-CM | POA: Diagnosis not present

## 2021-08-27 DIAGNOSIS — I10 Essential (primary) hypertension: Secondary | ICD-10-CM | POA: Diagnosis not present

## 2021-08-28 DIAGNOSIS — F200 Paranoid schizophrenia: Secondary | ICD-10-CM | POA: Diagnosis not present

## 2021-08-28 DIAGNOSIS — Z79899 Other long term (current) drug therapy: Secondary | ICD-10-CM | POA: Diagnosis not present

## 2021-09-09 DIAGNOSIS — Z79899 Other long term (current) drug therapy: Secondary | ICD-10-CM | POA: Diagnosis not present

## 2021-09-24 DIAGNOSIS — Z6841 Body Mass Index (BMI) 40.0 and over, adult: Secondary | ICD-10-CM | POA: Diagnosis not present

## 2021-10-07 DIAGNOSIS — Z79899 Other long term (current) drug therapy: Secondary | ICD-10-CM | POA: Diagnosis not present

## 2021-11-04 DIAGNOSIS — Z6841 Body Mass Index (BMI) 40.0 and over, adult: Secondary | ICD-10-CM | POA: Diagnosis not present

## 2021-11-05 DIAGNOSIS — Z79899 Other long term (current) drug therapy: Secondary | ICD-10-CM | POA: Diagnosis not present

## 2021-11-19 DIAGNOSIS — Z79899 Other long term (current) drug therapy: Secondary | ICD-10-CM | POA: Diagnosis not present

## 2021-11-19 DIAGNOSIS — F200 Paranoid schizophrenia: Secondary | ICD-10-CM | POA: Diagnosis not present

## 2021-11-26 DIAGNOSIS — E119 Type 2 diabetes mellitus without complications: Secondary | ICD-10-CM | POA: Diagnosis not present

## 2021-11-26 DIAGNOSIS — F209 Schizophrenia, unspecified: Secondary | ICD-10-CM | POA: Diagnosis not present

## 2021-11-26 DIAGNOSIS — R946 Abnormal results of thyroid function studies: Secondary | ICD-10-CM | POA: Diagnosis not present

## 2021-11-26 DIAGNOSIS — I10 Essential (primary) hypertension: Secondary | ICD-10-CM | POA: Diagnosis not present

## 2021-12-01 DIAGNOSIS — Z79899 Other long term (current) drug therapy: Secondary | ICD-10-CM | POA: Diagnosis not present

## 2021-12-16 DIAGNOSIS — E119 Type 2 diabetes mellitus without complications: Secondary | ICD-10-CM | POA: Diagnosis not present

## 2022-01-06 DIAGNOSIS — Z79899 Other long term (current) drug therapy: Secondary | ICD-10-CM | POA: Diagnosis not present

## 2022-02-03 DIAGNOSIS — Z79899 Other long term (current) drug therapy: Secondary | ICD-10-CM | POA: Diagnosis not present

## 2022-02-17 DIAGNOSIS — E119 Type 2 diabetes mellitus without complications: Secondary | ICD-10-CM | POA: Diagnosis not present

## 2022-02-18 DIAGNOSIS — Z79899 Other long term (current) drug therapy: Secondary | ICD-10-CM | POA: Diagnosis not present

## 2022-02-18 DIAGNOSIS — F200 Paranoid schizophrenia: Secondary | ICD-10-CM | POA: Diagnosis not present

## 2022-03-10 DIAGNOSIS — Z79899 Other long term (current) drug therapy: Secondary | ICD-10-CM | POA: Diagnosis not present

## 2022-03-11 DIAGNOSIS — I471 Supraventricular tachycardia: Secondary | ICD-10-CM | POA: Diagnosis not present

## 2022-03-11 DIAGNOSIS — I10 Essential (primary) hypertension: Secondary | ICD-10-CM | POA: Diagnosis not present

## 2022-03-11 DIAGNOSIS — E119 Type 2 diabetes mellitus without complications: Secondary | ICD-10-CM | POA: Diagnosis not present

## 2022-04-02 DIAGNOSIS — Z79899 Other long term (current) drug therapy: Secondary | ICD-10-CM | POA: Diagnosis not present

## 2022-04-15 DIAGNOSIS — E119 Type 2 diabetes mellitus without complications: Secondary | ICD-10-CM | POA: Diagnosis not present

## 2022-05-06 DIAGNOSIS — Z79899 Other long term (current) drug therapy: Secondary | ICD-10-CM | POA: Diagnosis not present

## 2022-05-20 DIAGNOSIS — Z79899 Other long term (current) drug therapy: Secondary | ICD-10-CM | POA: Diagnosis not present

## 2022-05-20 DIAGNOSIS — F200 Paranoid schizophrenia: Secondary | ICD-10-CM | POA: Diagnosis not present

## 2022-05-27 DIAGNOSIS — E119 Type 2 diabetes mellitus without complications: Secondary | ICD-10-CM | POA: Diagnosis not present

## 2022-05-27 DIAGNOSIS — Z6841 Body Mass Index (BMI) 40.0 and over, adult: Secondary | ICD-10-CM | POA: Diagnosis not present

## 2022-06-09 DIAGNOSIS — Z79899 Other long term (current) drug therapy: Secondary | ICD-10-CM | POA: Diagnosis not present

## 2022-06-10 DIAGNOSIS — D649 Anemia, unspecified: Secondary | ICD-10-CM | POA: Diagnosis not present

## 2022-06-10 DIAGNOSIS — E559 Vitamin D deficiency, unspecified: Secondary | ICD-10-CM | POA: Diagnosis not present

## 2022-06-10 DIAGNOSIS — E119 Type 2 diabetes mellitus without complications: Secondary | ICD-10-CM | POA: Diagnosis not present

## 2022-06-10 DIAGNOSIS — I10 Essential (primary) hypertension: Secondary | ICD-10-CM | POA: Diagnosis not present

## 2022-06-10 DIAGNOSIS — R946 Abnormal results of thyroid function studies: Secondary | ICD-10-CM | POA: Diagnosis not present

## 2022-06-10 DIAGNOSIS — F209 Schizophrenia, unspecified: Secondary | ICD-10-CM | POA: Diagnosis not present

## 2022-06-10 DIAGNOSIS — Z0000 Encounter for general adult medical examination without abnormal findings: Secondary | ICD-10-CM | POA: Diagnosis not present

## 2022-06-10 DIAGNOSIS — D582 Other hemoglobinopathies: Secondary | ICD-10-CM | POA: Diagnosis not present

## 2022-06-10 DIAGNOSIS — Z124 Encounter for screening for malignant neoplasm of cervix: Secondary | ICD-10-CM | POA: Diagnosis not present

## 2022-07-08 DIAGNOSIS — Z79899 Other long term (current) drug therapy: Secondary | ICD-10-CM | POA: Diagnosis not present

## 2022-07-09 DIAGNOSIS — G4733 Obstructive sleep apnea (adult) (pediatric): Secondary | ICD-10-CM | POA: Diagnosis not present

## 2022-07-09 DIAGNOSIS — R030 Elevated blood-pressure reading, without diagnosis of hypertension: Secondary | ICD-10-CM | POA: Diagnosis not present

## 2022-07-09 DIAGNOSIS — E559 Vitamin D deficiency, unspecified: Secondary | ICD-10-CM | POA: Diagnosis not present

## 2022-07-09 DIAGNOSIS — Z6841 Body Mass Index (BMI) 40.0 and over, adult: Secondary | ICD-10-CM | POA: Diagnosis not present

## 2022-08-04 DIAGNOSIS — Z79899 Other long term (current) drug therapy: Secondary | ICD-10-CM | POA: Diagnosis not present

## 2022-08-05 DIAGNOSIS — I10 Essential (primary) hypertension: Secondary | ICD-10-CM | POA: Diagnosis not present

## 2022-08-05 DIAGNOSIS — Z6841 Body Mass Index (BMI) 40.0 and over, adult: Secondary | ICD-10-CM | POA: Diagnosis not present

## 2022-08-05 DIAGNOSIS — G4733 Obstructive sleep apnea (adult) (pediatric): Secondary | ICD-10-CM | POA: Diagnosis not present

## 2022-08-12 DIAGNOSIS — Z79899 Other long term (current) drug therapy: Secondary | ICD-10-CM | POA: Diagnosis not present

## 2022-08-12 DIAGNOSIS — F200 Paranoid schizophrenia: Secondary | ICD-10-CM | POA: Diagnosis not present

## 2022-09-01 DIAGNOSIS — E119 Type 2 diabetes mellitus without complications: Secondary | ICD-10-CM | POA: Diagnosis not present

## 2022-09-01 DIAGNOSIS — Z6841 Body Mass Index (BMI) 40.0 and over, adult: Secondary | ICD-10-CM | POA: Diagnosis not present

## 2022-09-02 DIAGNOSIS — Z79899 Other long term (current) drug therapy: Secondary | ICD-10-CM | POA: Diagnosis not present

## 2022-09-09 DIAGNOSIS — I10 Essential (primary) hypertension: Secondary | ICD-10-CM | POA: Diagnosis not present

## 2022-09-09 DIAGNOSIS — E119 Type 2 diabetes mellitus without complications: Secondary | ICD-10-CM | POA: Diagnosis not present

## 2022-09-29 DIAGNOSIS — Z79899 Other long term (current) drug therapy: Secondary | ICD-10-CM | POA: Diagnosis not present

## 2022-10-29 DIAGNOSIS — Z79899 Other long term (current) drug therapy: Secondary | ICD-10-CM | POA: Diagnosis not present

## 2022-11-03 DIAGNOSIS — E119 Type 2 diabetes mellitus without complications: Secondary | ICD-10-CM | POA: Diagnosis not present

## 2022-11-03 DIAGNOSIS — Z6841 Body Mass Index (BMI) 40.0 and over, adult: Secondary | ICD-10-CM | POA: Diagnosis not present

## 2022-11-04 DIAGNOSIS — Z79899 Other long term (current) drug therapy: Secondary | ICD-10-CM | POA: Diagnosis not present

## 2022-11-04 DIAGNOSIS — F200 Paranoid schizophrenia: Secondary | ICD-10-CM | POA: Diagnosis not present

## 2022-12-02 DIAGNOSIS — F209 Schizophrenia, unspecified: Secondary | ICD-10-CM | POA: Diagnosis not present

## 2022-12-02 DIAGNOSIS — D582 Other hemoglobinopathies: Secondary | ICD-10-CM | POA: Diagnosis not present

## 2022-12-02 DIAGNOSIS — E119 Type 2 diabetes mellitus without complications: Secondary | ICD-10-CM | POA: Diagnosis not present

## 2022-12-02 DIAGNOSIS — I10 Essential (primary) hypertension: Secondary | ICD-10-CM | POA: Diagnosis not present

## 2022-12-08 DIAGNOSIS — Z79899 Other long term (current) drug therapy: Secondary | ICD-10-CM | POA: Diagnosis not present

## 2022-12-29 DIAGNOSIS — Z6841 Body Mass Index (BMI) 40.0 and over, adult: Secondary | ICD-10-CM | POA: Diagnosis not present

## 2022-12-29 DIAGNOSIS — I10 Essential (primary) hypertension: Secondary | ICD-10-CM | POA: Diagnosis not present

## 2022-12-29 DIAGNOSIS — E119 Type 2 diabetes mellitus without complications: Secondary | ICD-10-CM | POA: Diagnosis not present

## 2022-12-30 DIAGNOSIS — Z79899 Other long term (current) drug therapy: Secondary | ICD-10-CM | POA: Diagnosis not present

## 2023-02-02 DIAGNOSIS — Z79899 Other long term (current) drug therapy: Secondary | ICD-10-CM | POA: Diagnosis not present

## 2023-02-03 DIAGNOSIS — F200 Paranoid schizophrenia: Secondary | ICD-10-CM | POA: Diagnosis not present

## 2023-02-03 DIAGNOSIS — F411 Generalized anxiety disorder: Secondary | ICD-10-CM | POA: Diagnosis not present

## 2023-02-03 DIAGNOSIS — Z79899 Other long term (current) drug therapy: Secondary | ICD-10-CM | POA: Diagnosis not present

## 2023-03-09 DIAGNOSIS — Z79899 Other long term (current) drug therapy: Secondary | ICD-10-CM | POA: Diagnosis not present

## 2023-03-30 DIAGNOSIS — Z6841 Body Mass Index (BMI) 40.0 and over, adult: Secondary | ICD-10-CM | POA: Diagnosis not present

## 2023-04-07 DIAGNOSIS — I10 Essential (primary) hypertension: Secondary | ICD-10-CM | POA: Diagnosis not present

## 2023-04-07 DIAGNOSIS — E119 Type 2 diabetes mellitus without complications: Secondary | ICD-10-CM | POA: Diagnosis not present

## 2023-04-07 DIAGNOSIS — R000 Tachycardia, unspecified: Secondary | ICD-10-CM | POA: Diagnosis not present

## 2023-04-08 DIAGNOSIS — Z79899 Other long term (current) drug therapy: Secondary | ICD-10-CM | POA: Diagnosis not present

## 2023-05-05 DIAGNOSIS — Z79899 Other long term (current) drug therapy: Secondary | ICD-10-CM | POA: Diagnosis not present

## 2023-05-05 DIAGNOSIS — F411 Generalized anxiety disorder: Secondary | ICD-10-CM | POA: Diagnosis not present

## 2023-05-05 DIAGNOSIS — F200 Paranoid schizophrenia: Secondary | ICD-10-CM | POA: Diagnosis not present

## 2023-05-12 DIAGNOSIS — Z79899 Other long term (current) drug therapy: Secondary | ICD-10-CM | POA: Diagnosis not present

## 2023-06-02 DIAGNOSIS — Z79899 Other long term (current) drug therapy: Secondary | ICD-10-CM | POA: Diagnosis not present

## 2023-06-30 DIAGNOSIS — F209 Schizophrenia, unspecified: Secondary | ICD-10-CM | POA: Diagnosis not present

## 2023-06-30 DIAGNOSIS — E559 Vitamin D deficiency, unspecified: Secondary | ICD-10-CM | POA: Diagnosis not present

## 2023-06-30 DIAGNOSIS — Z0000 Encounter for general adult medical examination without abnormal findings: Secondary | ICD-10-CM | POA: Diagnosis not present

## 2023-06-30 DIAGNOSIS — E119 Type 2 diabetes mellitus without complications: Secondary | ICD-10-CM | POA: Diagnosis not present

## 2023-06-30 DIAGNOSIS — I10 Essential (primary) hypertension: Secondary | ICD-10-CM | POA: Diagnosis not present

## 2023-06-30 DIAGNOSIS — R000 Tachycardia, unspecified: Secondary | ICD-10-CM | POA: Diagnosis not present

## 2023-07-08 DIAGNOSIS — Z79899 Other long term (current) drug therapy: Secondary | ICD-10-CM | POA: Diagnosis not present

## 2023-07-20 DIAGNOSIS — F209 Schizophrenia, unspecified: Secondary | ICD-10-CM | POA: Diagnosis not present

## 2023-07-20 DIAGNOSIS — R5382 Chronic fatigue, unspecified: Secondary | ICD-10-CM | POA: Diagnosis not present

## 2023-07-21 DIAGNOSIS — F411 Generalized anxiety disorder: Secondary | ICD-10-CM | POA: Diagnosis not present

## 2023-07-21 DIAGNOSIS — F200 Paranoid schizophrenia: Secondary | ICD-10-CM | POA: Diagnosis not present

## 2023-07-21 DIAGNOSIS — Z79899 Other long term (current) drug therapy: Secondary | ICD-10-CM | POA: Diagnosis not present

## 2023-07-21 DIAGNOSIS — G4733 Obstructive sleep apnea (adult) (pediatric): Secondary | ICD-10-CM | POA: Diagnosis not present

## 2023-07-28 DIAGNOSIS — Z6841 Body Mass Index (BMI) 40.0 and over, adult: Secondary | ICD-10-CM | POA: Diagnosis not present

## 2023-08-02 ENCOUNTER — Telehealth: Payer: Self-pay | Admitting: Neurology

## 2023-08-02 NOTE — Telephone Encounter (Signed)
Referral reviewed by Dr. Vickey Huger and declined, referring office notified of decision.

## 2023-08-02 NOTE — Telephone Encounter (Signed)
Received sleep referral from Dr. Laurann Montana at [facility] for second opinion hx sleep apnea, CPAP intolerant, using appliance, still fatigued. Placed in sleep referrals box

## 2023-08-09 DIAGNOSIS — Z79899 Other long term (current) drug therapy: Secondary | ICD-10-CM | POA: Diagnosis not present

## 2023-09-08 DIAGNOSIS — Z79899 Other long term (current) drug therapy: Secondary | ICD-10-CM | POA: Diagnosis not present

## 2023-09-28 DIAGNOSIS — I10 Essential (primary) hypertension: Secondary | ICD-10-CM | POA: Diagnosis not present

## 2023-09-28 DIAGNOSIS — E119 Type 2 diabetes mellitus without complications: Secondary | ICD-10-CM | POA: Diagnosis not present

## 2023-10-05 DIAGNOSIS — Z79899 Other long term (current) drug therapy: Secondary | ICD-10-CM | POA: Diagnosis not present

## 2023-10-12 DIAGNOSIS — G4733 Obstructive sleep apnea (adult) (pediatric): Secondary | ICD-10-CM | POA: Diagnosis not present

## 2023-10-20 DIAGNOSIS — F200 Paranoid schizophrenia: Secondary | ICD-10-CM | POA: Diagnosis not present

## 2023-10-20 DIAGNOSIS — Z79899 Other long term (current) drug therapy: Secondary | ICD-10-CM | POA: Diagnosis not present

## 2023-10-20 DIAGNOSIS — F411 Generalized anxiety disorder: Secondary | ICD-10-CM | POA: Diagnosis not present

## 2023-11-03 DIAGNOSIS — Z79899 Other long term (current) drug therapy: Secondary | ICD-10-CM | POA: Diagnosis not present

## 2023-11-10 DIAGNOSIS — G4733 Obstructive sleep apnea (adult) (pediatric): Secondary | ICD-10-CM | POA: Diagnosis not present

## 2023-11-18 DIAGNOSIS — G4733 Obstructive sleep apnea (adult) (pediatric): Secondary | ICD-10-CM | POA: Diagnosis not present

## 2023-12-09 DIAGNOSIS — Z79899 Other long term (current) drug therapy: Secondary | ICD-10-CM | POA: Diagnosis not present

## 2023-12-15 DIAGNOSIS — Z6841 Body Mass Index (BMI) 40.0 and over, adult: Secondary | ICD-10-CM | POA: Diagnosis not present

## 2023-12-29 DIAGNOSIS — I10 Essential (primary) hypertension: Secondary | ICD-10-CM | POA: Diagnosis not present

## 2023-12-29 DIAGNOSIS — E119 Type 2 diabetes mellitus without complications: Secondary | ICD-10-CM | POA: Diagnosis not present

## 2023-12-29 DIAGNOSIS — R000 Tachycardia, unspecified: Secondary | ICD-10-CM | POA: Diagnosis not present

## 2023-12-29 DIAGNOSIS — G4730 Sleep apnea, unspecified: Secondary | ICD-10-CM | POA: Diagnosis not present

## 2024-01-04 DIAGNOSIS — Z79899 Other long term (current) drug therapy: Secondary | ICD-10-CM | POA: Diagnosis not present

## 2024-01-06 DIAGNOSIS — G4733 Obstructive sleep apnea (adult) (pediatric): Secondary | ICD-10-CM | POA: Diagnosis not present

## 2024-01-12 DIAGNOSIS — Z79899 Other long term (current) drug therapy: Secondary | ICD-10-CM | POA: Diagnosis not present

## 2024-01-12 DIAGNOSIS — R5383 Other fatigue: Secondary | ICD-10-CM | POA: Diagnosis not present

## 2024-01-12 DIAGNOSIS — F411 Generalized anxiety disorder: Secondary | ICD-10-CM | POA: Diagnosis not present

## 2024-01-12 DIAGNOSIS — F200 Paranoid schizophrenia: Secondary | ICD-10-CM | POA: Diagnosis not present

## 2024-01-13 DIAGNOSIS — G4733 Obstructive sleep apnea (adult) (pediatric): Secondary | ICD-10-CM | POA: Diagnosis not present

## 2024-01-18 ENCOUNTER — Emergency Department (HOSPITAL_BASED_OUTPATIENT_CLINIC_OR_DEPARTMENT_OTHER)

## 2024-01-18 ENCOUNTER — Emergency Department (HOSPITAL_BASED_OUTPATIENT_CLINIC_OR_DEPARTMENT_OTHER): Admitting: Radiology

## 2024-01-18 ENCOUNTER — Emergency Department (HOSPITAL_BASED_OUTPATIENT_CLINIC_OR_DEPARTMENT_OTHER)
Admission: EM | Admit: 2024-01-18 | Discharge: 2024-01-18 | Disposition: A | Discharge status: Home / Self Care | Source: Ambulatory Visit | Attending: Emergency Medicine | Admitting: Emergency Medicine

## 2024-01-18 ENCOUNTER — Other Ambulatory Visit: Payer: Self-pay

## 2024-01-18 ENCOUNTER — Encounter (HOSPITAL_BASED_OUTPATIENT_CLINIC_OR_DEPARTMENT_OTHER): Payer: Self-pay | Admitting: Emergency Medicine

## 2024-01-18 DIAGNOSIS — E119 Type 2 diabetes mellitus without complications: Secondary | ICD-10-CM | POA: Insufficient documentation

## 2024-01-18 DIAGNOSIS — E162 Hypoglycemia, unspecified: Secondary | ICD-10-CM | POA: Diagnosis not present

## 2024-01-18 DIAGNOSIS — R899 Unspecified abnormal finding in specimens from other organs, systems and tissues: Secondary | ICD-10-CM | POA: Insufficient documentation

## 2024-01-18 DIAGNOSIS — R0602 Shortness of breath: Secondary | ICD-10-CM | POA: Insufficient documentation

## 2024-01-18 DIAGNOSIS — R918 Other nonspecific abnormal finding of lung field: Secondary | ICD-10-CM | POA: Diagnosis not present

## 2024-01-18 DIAGNOSIS — R000 Tachycardia, unspecified: Secondary | ICD-10-CM | POA: Insufficient documentation

## 2024-01-18 DIAGNOSIS — D582 Other hemoglobinopathies: Secondary | ICD-10-CM | POA: Diagnosis not present

## 2024-01-18 DIAGNOSIS — K8020 Calculus of gallbladder without cholecystitis without obstruction: Secondary | ICD-10-CM | POA: Diagnosis not present

## 2024-01-18 DIAGNOSIS — R799 Abnormal finding of blood chemistry, unspecified: Secondary | ICD-10-CM | POA: Diagnosis not present

## 2024-01-18 DIAGNOSIS — R0902 Hypoxemia: Secondary | ICD-10-CM | POA: Diagnosis not present

## 2024-01-18 DIAGNOSIS — R7989 Other specified abnormal findings of blood chemistry: Secondary | ICD-10-CM | POA: Diagnosis not present

## 2024-01-18 LAB — PREGNANCY, URINE: Preg Test, Ur: NEGATIVE

## 2024-01-18 LAB — PRO BRAIN NATRIURETIC PEPTIDE: Pro Brain Natriuretic Peptide: <36 pg/mL (ref <300.0)

## 2024-01-18 LAB — TROPONIN T, HIGH SENSITIVITY: Troponin T High Sensitivity: <15 ng/L (ref <19)

## 2024-01-18 MED ORDER — SODIUM CHLORIDE 0.9 % IV BOLUS
500.0000 mL | Freq: Once | INTRAVENOUS | Status: AC
  Administered 2024-01-18: 500 mL via INTRAVENOUS

## 2024-01-18 MED ORDER — IOHEXOL 350 MG/ML SOLN
75.0000 mL | Freq: Once | INTRAVENOUS | Status: AC | PRN
Start: 2024-01-18 — End: 2024-01-18
  Administered 2024-01-18: 75 mL via INTRAVENOUS

## 2024-01-18 NOTE — ED Notes (Addendum)
 Reviewed discharge instructions and follow up care with pt and family. Pt verbalized understanding and had no further questions. Pt exited ED without complications.

## 2024-01-18 NOTE — Discharge Instructions (Signed)
 As we discussed, please follow back up with your PCP and continue your ongoing evaluations regarding treatment of sleep apnea.   If you have any concerning symptoms, please return to the ED for further monitoring.

## 2024-01-18 NOTE — ED Notes (Signed)
 Pt had full blood panel and d-dimer this morning at [facility].

## 2024-01-18 NOTE — ED Triage Notes (Signed)
 Pt via pov from PCP with low O2 level at their office, as well as elevated D-dimer. Pt was checking on her A1C in a routine appointment. She was sent to rule out blood clot. Pt states she is not sob, but her breathing is heavy. Pt alert & oriented, nad noted.

## 2024-01-18 NOTE — ED Provider Notes (Signed)
 [place] EMERGENCY DEPARTMENT AT [facility] Provider Note   CSN: [id number] Arrival date & time: 01/18/24  1309     Patient presents with: Shortness of Breath   Kahlen Vanengen is a 38 y.o. female.   Patient sent from primary care provider's office to be evaluated for possible PE. She was there on routine visit today and found to have a low oxygen saturation, parents feel high 80's. When they returned home, parents checked it again and found it to be 82%. The patient denies any sense of SOB, chest pain, cough, fever. No vomiting. No history of clots. No recent travel, use of exogenous estrogens, no lower extremity pain or swelling.  The history is provided by the patient. No language interpreter was used.  Shortness of Breath      Prior to Admission medications   Medication Sig Start Date End Date Taking? Authorizing Provider  Cholecalciferol (VITAMIN D3) 2000 UNITS TABS Take by mouth.    [provider]  clonazePAM (KLONOPIN) 0.5 MG tablet Take 0.5 mg by mouth 2 (two) times daily as needed for anxiety.    [provider]  cloZAPine (CLOZARIL) 100 MG tablet Take 100 mg by mouth daily.    [provider]  Evening Primrose Oil 500 MG CAPS Take by mouth.    [provider]  ibuprofen  (ADVIL ,MOTRIN ) 600 MG tablet Take 1 tablet (600 mg total) by mouth every 8 (eight) hours as needed. 04/30/15   Gretta Ozell CROME, PA-C  LATUDA 20 MG TABS tablet Take 10 mg by mouth 2 (two) times daily. 05/28/15   [provider]  LATUDA 60 MG TABS Take 1 tablet by mouth daily. 05/27/15   [provider]  Multiple Vitamin (MULTIVITAMIN) tablet Take 1 tablet by mouth daily.    [provider]  naproxen sodium (ANAPROX) 550 MG tablet Take 550 mg by mouth 2 (two) times daily with a meal.    [provider]  omega-3 acid ethyl esters (LOVAZA) 1 G capsule Take by mouth 2 (two) times daily.    [provider]   topiramate (TOPAMAX) 100 MG tablet Take 100 mg by mouth 2 (two) times daily.    [provider]  triamcinolone  (KENALOG ) 0.025 % cream Apply to affected area twice daily, rub into skin thoroughly, apply barrier cream such as Eucerin after triamcinolone  is completely absorbed. 06/23/21   Joesph Shaver Scales, PA-C    Allergies: Patient has no known allergies.    Review of Systems  Respiratory:  Positive for shortness of breath.     Updated Vital Signs BP (!) 151/86   Pulse 97   Temp 97.9 F (36.6 C)   Resp (!) 22   Ht 5' 1 (1.549 m)   Wt 111.6 kg   LMP 01/10/2024 (Approximate)   SpO2 94%   BMI 46.48 kg/m   Physical Exam Vitals and nursing note reviewed.  Constitutional:      Appearance: She is well-developed.  HENT:     Head: Normocephalic.  Cardiovascular:     Rate and Rhythm: Regular rhythm. Tachycardia present.     Heart sounds: No murmur heard. Pulmonary:     Effort: Pulmonary effort is normal.     Breath sounds: Normal breath sounds. No wheezing, rhonchi or rales.  Abdominal:     General: Bowel sounds are normal.     Palpations: Abdomen is soft.     Tenderness: There is no abdominal tenderness. There is no guarding or rebound.  Musculoskeletal:  General: No tenderness. Normal range of motion.     Cervical back: Normal range of motion and neck supple.     Right lower leg: No edema.     Left lower leg: No edema.  Skin:    General: Skin is warm and dry.  Neurological:     General: No focal deficit present.     Mental Status: She is alert and oriented to person, place, and time.     (all labs ordered are listed, but only abnormal results are displayed) Labs Reviewed  PREGNANCY, URINE  PRO BRAIN NATRIURETIC PEPTIDE  TROPONIN T, HIGH SENSITIVITY    EKG: None  Radiology: CT Angio Chest PE W and/or Wo Contrast Result Date: 01/18/2024 CLINICAL DATA:  Hypoxia, elevated D-dimer level. EXAM: CT ANGIOGRAPHY CHEST WITH CONTRAST TECHNIQUE:  Multidetector CT imaging of the chest was performed using the standard protocol during bolus administration of intravenous contrast. Multiplanar CT image reconstructions and MIPs were obtained to evaluate the vascular anatomy. RADIATION DOSE REDUCTION: This exam was performed according to the departmental dose-optimization program which includes automated exposure control, adjustment of the mA and/or kV according to patient size and/or use of iterative reconstruction technique. CONTRAST:  75mL OMNIPAQUE  IOHEXOL  350 MG/ML SOLN COMPARISON:  None Available. FINDINGS: Cardiovascular: No definite evidence of large central pulmonary embolus seen in the main pulmonary artery or proximal portions of the left and right pulmonary arteries. However, there is limited opacification of the more peripheral branches of the pulmonary arteries potentially due to timing of contrast bolus or soft tissue attenuation artifact. Smaller and more peripheral pulmonary emboli cannot be excluded on the basis of this exam. Normal cardiac size. No pericardial effusion. Mediastinum/Nodes: No enlarged mediastinal, hilar, or axillary lymph nodes. Thyroid  gland, trachea, and esophagus demonstrate no significant findings. Lungs/Pleura: Lungs are clear. No pleural effusion or pneumothorax. Upper Abdomen: Cholelithiasis. Musculoskeletal: No chest wall abnormality. No acute or significant osseous findings. Review of the MIP images confirms the above findings. IMPRESSION: No definite evidence of large central pulmonary embolus seen in the main pulmonary artery or proximal portions of the left and right pulmonary arteries. However, there is limited opacification of more peripheral branches of pulmonary arteries potentially due to timing of contrast bolus or soft tissue attenuation artifact. Smaller and more peripheral pulmonary emboli cannot be excluded on the basis of this exam. Cholelithiasis. Electronically Signed   By: Lynwood Landy Raddle M.D.   On:  01/18/2024 18:23   DG Chest 2 View Result Date: 01/18/2024 CLINICAL DATA:  sob EXAM: CHEST - 2 VIEW COMPARISON:  None available. FINDINGS: No focal airspace consolidation, pleural effusion, or pneumothorax. No cardiomegaly.No acute fracture or destructive lesion. Multilevel thoracic osteophytosis. IMPRESSION: No acute cardiopulmonary abnormality. Electronically Signed   By: Rogelia Myers M.D.   On: 01/18/2024 14:59     Procedures   Medications Ordered in the ED  sodium chloride  0.9 % bolus 500 mL (0 mLs Intravenous Stopped 01/18/24 1844)  iohexol  (OMNIPAQUE ) 350 MG/ML injection 75 mL (75 mLs Intravenous Contrast Given 01/18/24 1759)                                    Medical Decision Making This patient presents to the ED for concern of elevated d-dimer, this involves an extensive number of treatment options, and is a complaint that carries with it a high risk of complications and morbidity.  The differential diagnosis includes PE, DVT,  Co morbidities that complicate the patient evaluation  Schizophrenia, obesity, Vit D Def, OSA, T2DM, tachycardia   Additional history obtained:  Additional history and/or information obtained from chart review, notable for Notes from [facility] office visit earlier today showing d-dimer 0.56.    Lab Tests:  I Ordered, and personally interpreted labs.  The pertinent results include:   Pregnancy -     Imaging Studies ordered:  I ordered imaging studies including CTA PE Per radiologist interpretation:  IMPRESSION: No definite evidence of large central pulmonary embolus seen in the main pulmonary artery or proximal portions of the left and right pulmonary arteries. However, there is limited opacification of more peripheral branches of pulmonary arteries potentially due to timing of contrast bolus or soft tissue attenuation artifact. Smaller and more peripheral pulmonary emboli cannot be excluded on the basis of this  exam.    Cardiac Monitoring:  The patient was maintained on a cardiac monitor.  I personally viewed and interpreted the cardiac monitored which showed an underlying rhythm of: Sinus tachycardia   Medicines ordered and prescription drug management:  I ordered medication including n/a  for n/a Reevaluation of the patient after these medicines showed that the patient stayed the same - asymptomatic I have reviewed the patients home medicines and have made adjustments as needed   Test Considered:  N/a   Critical Interventions:  N/a   Consultations Obtained:  I requested consultation with the n/a,  and discussed lab and imaging findings as well as pertinent plan - they recommend: n/a   Problem List / ED Course:  Here from PCP with concern for PE with low O2 sats and a mildly elevated d-dimer at 0.56 CTA PE - negative for large vessel occlusion - smaller peripheral arteries not visualized definitively. No risk factor for PE, CXR clear, neg BNP and troponin.   She continues to be asymptomatic in the ED. O2 saturations on the monitor around 94%. There are times when it would dip down to 89%. She is ambulated with pulse ox monitoring and stays at 94%.   Discussed with Dr. Ruthe who feels she is appropriate for discharge home. Parents and patient comfortable with discharge.    Reevaluation:  After the interventions noted above, I reevaluated the patient and found that they have :stayed the same - asymptomatic   Social Determinants of Health:  Never a smoker   Disposition:  After consideration of the diagnostic results and the patients response to treatment, I feel that the patient would benefit from discharge home.   Amount and/or Complexity of Data Reviewed Labs: ordered. Radiology: ordered.  Risk Prescription drug management.        Final diagnoses:  Abnormal laboratory test    ED Discharge Orders     None          Odell Balls,  PA-C 01/18/24 1934    Ruthe Cornet, DO 01/18/24 1943

## 2024-01-18 NOTE — ED Notes (Signed)
 Patient transported to CT

## 2024-01-18 NOTE — ED Notes (Signed)
 Patient ambulated around unit with this RT and NT. Patient tolerated ambulation well. Lowest SpO2 94%, Max HR 125. PA Upstill aware and at bedside upon return to room. PA updated family on plan of care.

## 2024-01-23 NOTE — Progress Notes (Unsigned)
 @Patient  ID: Chelsey Villa, female    DOB: Sep 16, 1985, 38 y.o.   MRN: [id number]  No chief complaint on file.   Referring provider: Teresa Channel, MD  HPI: 38 year old female, never smoked. PMH significant for OSA, diabetes, schizophrenia, tachycardia, vit D deficiency, obesity.   01/24/2024 Patient presents today for sleep consult.   No previous sleep study on file       No Known Allergies  Immunization History  Administered Date(s) Administered   PFIZER Comirnaty(Gray Top)Covid-19 Tri-Sucrose Vaccine 12/13/2020, 05/02/2021   Pfizer(Comirnaty)Fall Seasonal Vaccine 12 years and older 07/24/2022, 03/19/2023    Past Medical History:  Diagnosis Date   Diabetes mellitus without complication (HCC)    Hemoglobin C (Hb-C) (HCC)    Obesity    OSA (obstructive sleep apnea)    Schizophrenia (HCC)    Tachycardia    Vitamin D deficiency     Tobacco History: Social History   Tobacco Use  Smoking Status Never  Smokeless Tobacco Not on file   Counseling given: Not Answered   Outpatient Medications Prior to Visit  Medication Sig Dispense Refill   Cholecalciferol (VITAMIN D3) 2000 UNITS TABS Take by mouth.     clonazePAM (KLONOPIN) 0.5 MG tablet Take 0.5 mg by mouth 2 (two) times daily as needed for anxiety.     cloZAPine (CLOZARIL) 100 MG tablet Take 100 mg by mouth daily.     Evening Primrose Oil 500 MG CAPS Take by mouth.     ibuprofen  (ADVIL ,MOTRIN ) 600 MG tablet Take 1 tablet (600 mg total) by mouth every 8 (eight) hours as needed. 30 tablet 1   LATUDA 20 MG TABS tablet Take 10 mg by mouth 2 (two) times daily.  1   LATUDA 60 MG TABS Take 1 tablet by mouth daily.  2   Multiple Vitamin (MULTIVITAMIN) tablet Take 1 tablet by mouth daily.     naproxen sodium (ANAPROX) 550 MG tablet Take 550 mg by mouth 2 (two) times daily with a meal.     omega-3 acid ethyl esters (LOVAZA) 1 G capsule Take by mouth 2 (two) times daily.     topiramate (TOPAMAX) 100 MG tablet Take 100  mg by mouth 2 (two) times daily.     triamcinolone  (KENALOG ) 0.025 % cream Apply to affected area twice daily, rub into skin thoroughly, apply barrier cream such as Eucerin after triamcinolone  is completely absorbed. 80 g 0   No facility-administered medications prior to visit.      Review of Systems  Review of Systems   Physical Exam  LMP 01/10/2024 (Approximate)  Physical Exam   Lab Results:  CBC    Component Value Date/Time   WBC 10.0 04/30/2015 1514   RBC 3.89 (A) 04/30/2015 1514   HGB 10.1 (A) 04/30/2015 1514   HCT 29.8 (A) 04/30/2015 1514   MCV 76.7 (A) 04/30/2015 1514   MCH 26.0 (A) 04/30/2015 1514   MCHC 33.9 04/30/2015 1514    BMET No results found for: NA, K, CL, CO2, GLUCOSE, BUN, CREATININE, CALCIUM, GFRNONAA, GFRAA  BNP No results found for: BNP  ProBNP    Component Value Date/Time   PROBNP <36.0 01/18/2024 1715    Imaging: CT Angio Chest PE W and/or Wo Contrast Result Date: 01/18/2024 CLINICAL DATA:  Hypoxia, elevated D-dimer level. EXAM: CT ANGIOGRAPHY CHEST WITH CONTRAST TECHNIQUE: Multidetector CT imaging of the chest was performed using the standard protocol during bolus administration of intravenous contrast. Multiplanar CT image reconstructions and MIPs were obtained to  evaluate the vascular anatomy. RADIATION DOSE REDUCTION: This exam was performed according to the departmental dose-optimization program which includes automated exposure control, adjustment of the mA and/or kV according to patient size and/or use of iterative reconstruction technique. CONTRAST:  75mL OMNIPAQUE  IOHEXOL  350 MG/ML SOLN COMPARISON:  None Available. FINDINGS: Cardiovascular: No definite evidence of large central pulmonary embolus seen in the main pulmonary artery or proximal portions of the left and right pulmonary arteries. However, there is limited opacification of the more peripheral branches of the pulmonary arteries potentially due to timing of  contrast bolus or soft tissue attenuation artifact. Smaller and more peripheral pulmonary emboli cannot be excluded on the basis of this exam. Normal cardiac size. No pericardial effusion. Mediastinum/Nodes: No enlarged mediastinal, hilar, or axillary lymph nodes. Thyroid  gland, trachea, and esophagus demonstrate no significant findings. Lungs/Pleura: Lungs are clear. No pleural effusion or pneumothorax. Upper Abdomen: Cholelithiasis. Musculoskeletal: No chest wall abnormality. No acute or significant osseous findings. Review of the MIP images confirms the above findings. IMPRESSION: No definite evidence of large central pulmonary embolus seen in the main pulmonary artery or proximal portions of the left and right pulmonary arteries. However, there is limited opacification of more peripheral branches of pulmonary arteries potentially due to timing of contrast bolus or soft tissue attenuation artifact. Smaller and more peripheral pulmonary emboli cannot be excluded on the basis of this exam. Cholelithiasis. Electronically Signed   By: Lynwood Landy Raddle M.D.   On: 01/18/2024 18:23   DG Chest 2 View Result Date: 01/18/2024 CLINICAL DATA:  sob EXAM: CHEST - 2 VIEW COMPARISON:  None available. FINDINGS: No focal airspace consolidation, pleural effusion, or pneumothorax. No cardiomegaly.No acute fracture or destructive lesion. Multilevel thoracic osteophytosis. IMPRESSION: No acute cardiopulmonary abnormality. Electronically Signed   By: Rogelia Myers M.D.   On: 01/18/2024 14:59     Assessment & Plan:   No problem-specific Assessment & Plan notes found for this encounter.     Chelsey LELON Ferrari, NP 01/23/2024

## 2024-01-24 ENCOUNTER — Ambulatory Visit (INDEPENDENT_AMBULATORY_CARE_PROVIDER_SITE_OTHER): Admitting: Primary Care

## 2024-01-24 ENCOUNTER — Other Ambulatory Visit (HOSPITAL_COMMUNITY): Payer: Self-pay | Admitting: Primary Care

## 2024-01-24 ENCOUNTER — Telehealth: Payer: Self-pay

## 2024-01-24 ENCOUNTER — Encounter: Payer: Self-pay | Admitting: Primary Care

## 2024-01-24 ENCOUNTER — Telehealth: Payer: Self-pay | Admitting: *Deleted

## 2024-01-24 VITALS — BP 130/70 | HR 115 | Temp 97.3°F | Ht 61.0 in | Wt 245.6 lb

## 2024-01-24 DIAGNOSIS — R0902 Hypoxemia: Secondary | ICD-10-CM | POA: Diagnosis not present

## 2024-01-24 DIAGNOSIS — D5720 Sickle-cell/Hb-C disease without crisis: Secondary | ICD-10-CM | POA: Diagnosis not present

## 2024-01-24 DIAGNOSIS — G4733 Obstructive sleep apnea (adult) (pediatric): Secondary | ICD-10-CM | POA: Diagnosis not present

## 2024-01-24 LAB — IRON,TIBC AND FERRITIN PANEL
%SAT: 20 % (ref 16–45)
Ferritin: 26 ng/mL (ref 16–154)
Iron: 77 ug/dL (ref 40–190)
TIBC: 376 ug/dL (ref 250–450)

## 2024-01-24 LAB — CBC
HCT: 31.5 % — ABNORMAL LOW (ref 36.0–46.0)
Hemoglobin: 10.7 g/dL — ABNORMAL LOW (ref 12.0–15.0)
MCHC: 34.1 g/dL (ref 30.0–36.0)
MCV: 71.3 fl — ABNORMAL LOW (ref 78.0–100.0)
Platelets: 279 K/uL (ref 150.0–400.0)
RBC: 4.42 Mil/uL (ref 3.87–5.11)
RDW: 21.1 % — ABNORMAL HIGH (ref 11.5–15.5)
WBC: 8 K/uL (ref 4.0–10.5)

## 2024-01-24 LAB — BRAIN NATRIURETIC PEPTIDE: Pro B Natriuretic peptide (BNP): 7 pg/mL (ref 0.0–100.0)

## 2024-01-24 NOTE — Telephone Encounter (Signed)
 A BNP is blood work, she had this done after her visit. We can call with results when they are ready.

## 2024-01-24 NOTE — Telephone Encounter (Signed)
 PT not sure what nuclear testing she is supposed to go in for. AVS states: Brain natriuretic peptide Please call to explain. Someone called her to make an appt for this test tomorrow. [phone]

## 2024-01-24 NOTE — Telephone Encounter (Signed)
 Copied from CRM [id number]. Topic: Clinical - Medical Advice >> Jan 24, 2024 12:53 PM Celestine FALCON wrote: Reason for CRM: Pt stated she wanted a message to NP Almarie Ferrari or the clinical team regarding an appt scheduled on 01/25/2024 at 12pm at [facility] for [place] Pulmonary Perfusion. Please call the pt back at [phone] or [phone] ok to leave a vm.   Routing to the [facility] for scheduling.

## 2024-01-24 NOTE — Patient Instructions (Addendum)
  VISIT SUMMARY: Today, you were seen for a sleep consultation and evaluation of your low oxygen levels. We discussed your severe sleep apnea, which is contributing to your daytime fatigue and low oxygen levels. We also reviewed your history of hemoglobin C disease and the potential impact of discontinuing iron supplements on your oxygen levels. Your current medications and sleep patterns were also discussed.  YOUR PLAN: -SEVERE SLEEP APNEA: Severe sleep apnea is a condition where your breathing repeatedly stops and starts during sleep, leading to poor sleep quality and low oxygen levels. Please follow up with Dr. Harl regarding the initiation of your BIPAP therapy.  -HYPOXEMIA: Hypoxemia means having low levels of oxygen in your blood, which can cause fatigue and other symptoms. This is likely related to your severe sleep apnea. We will conduct a pulmonary function test to check for lung issues, perform an ambulation test to assess your oxygen levels while walking, and order blood tests to check for anemia. Depending on the results, we may consider oxygen therapy.   CT scan showed clear lungs, no definite evidence large central PE, limited opacification peripheral branches of pulmonary arteries potentially due to timing for contrast bolus, smaller and more peripheral PE can not be excluded. Recommend getting perfusion scan of your lungs to assess for pulmonary embolism   -OBESITY HYPOVENTILATION SYNDROME: Obesity hypoventilation syndrome is a condition where excess weight affects your breathing, leading to low oxygen levels. We discussed weight management strategies and the potential use of weight loss medications like Ozempic. CPAP therapy may also help improve your symptoms.  -HEMOGLOBIN C DISEASE: Hemoglobin C disease is a blood disorder that can cause anemia and low oxygen levels. We will order blood tests to check for anemia and may refer you to a hematologist if anemia is  confirmed.  INSTRUCTIONS: Please follow up with Dr. Harl regarding the initiation of your BIPAP therapy. Additionally, we will conduct a pulmonary function test, an ambulation test, and blood tests to further evaluate your condition. Based on the results, we may consider oxygen therapy and a referral to a hematologist if anemia is confirmed.  Orders: Ambulatory oxygen test on room air with forehead probe   Perfusion test to assess for pulmonary embolism (ordered) Pulmonary function test to assess for obstructive lung disease (ordered) Labs today to assess for anemia (ordered)  Follow-up PFTs first available with follow-up any pulmonary MD (new patient) - 30 mins

## 2024-01-25 ENCOUNTER — Ambulatory Visit (HOSPITAL_COMMUNITY): Admission: RE | Admit: 2024-01-25 | Source: Ambulatory Visit

## 2024-01-25 ENCOUNTER — Ambulatory Visit (HOSPITAL_COMMUNITY)

## 2024-01-25 ENCOUNTER — Ambulatory Visit

## 2024-01-25 DIAGNOSIS — R0902 Hypoxemia: Secondary | ICD-10-CM

## 2024-01-25 LAB — PULMONARY FUNCTION TEST
DL/VA % pred: 163 %
DL/VA: 7.44 ml/min/mmHg/L
DLCO cor % pred: 99 %
DLCO cor: 19.68 ml/min/mmHg
DLCO unc % pred: 90 %
DLCO unc: 17.83 ml/min/mmHg
FEF 25-75 Post: 2.84 L/s
FEF 25-75 Pre: 1.79 L/s
FEF2575-%Change-Post: 58 %
FEF2575-%Pred-Post: 95 %
FEF2575-%Pred-Pre: 60 %
FEV1-%Change-Post: 13 %
FEV1-%Pred-Post: 62 %
FEV1-%Pred-Pre: 54 %
FEV1-Post: 1.71 L
FEV1-Pre: 1.5 L
FEV1FVC-%Change-Post: 2 %
FEV1FVC-%Pred-Pre: 103 %
FEV6-%Change-Post: 11 %
FEV6-%Pred-Post: 59 %
FEV6-%Pred-Pre: 53 %
FEV6-Post: 1.95 L
FEV6-Pre: 1.75 L
FEV6FVC-%Pred-Post: 101 %
FEV6FVC-%Pred-Pre: 101 %
FVC-%Change-Post: 11 %
FVC-%Pred-Post: 58 %
FVC-%Pred-Pre: 52 %
FVC-Post: 1.95 L
FVC-Pre: 1.76 L
Post FEV1/FVC ratio: 88 %
Post FEV6/FVC ratio: 100 %
Pre FEV1/FVC ratio: 86 %
Pre FEV6/FVC Ratio: 100 %
RV % pred: 99 %
RV: 1.43 L
TLC % pred: 73 %
TLC: 3.4 L

## 2024-01-25 NOTE — Patient Instructions (Signed)
 Full pft performed today.

## 2024-01-25 NOTE — Progress Notes (Signed)
 Full pft performed today.

## 2024-01-25 NOTE — Telephone Encounter (Signed)
 LVM for pt informing of appt. NFN

## 2024-01-26 ENCOUNTER — Ambulatory Visit (HOSPITAL_COMMUNITY)
Admission: RE | Admit: 2024-01-26 | Discharge: 2024-01-26 | Disposition: A | Discharge status: Home / Self Care | Source: Ambulatory Visit | Attending: Primary Care | Admitting: Primary Care

## 2024-01-26 ENCOUNTER — Ambulatory Visit: Payer: Self-pay | Admitting: Primary Care

## 2024-01-26 DIAGNOSIS — R0902 Hypoxemia: Secondary | ICD-10-CM | POA: Diagnosis not present

## 2024-01-26 DIAGNOSIS — Z0389 Encounter for observation for other suspected diseases and conditions ruled out: Secondary | ICD-10-CM | POA: Diagnosis not present

## 2024-01-26 MED ORDER — TECHNETIUM TO 99M ALBUMIN AGGREGATED
4.4000 | Freq: Once | INTRAVENOUS | Status: AC | PRN
  Administered 2024-01-26: 4.4 via INTRAVENOUS

## 2024-01-26 NOTE — Progress Notes (Signed)
 Please let patient know CXR was normal, no acute cardipulmonary disease. Degenerative changes to spine

## 2024-01-27 ENCOUNTER — Ambulatory Visit: Payer: Self-pay

## 2024-01-27 NOTE — Progress Notes (Signed)
 ATC X1. LMTCB

## 2024-01-27 NOTE — Progress Notes (Signed)
 Atc x1. lmtcb

## 2024-01-27 NOTE — Progress Notes (Signed)
 Please let patient know VQ scan and CTA showed low probability PE

## 2024-01-27 NOTE — Telephone Encounter (Signed)
 FYI Only or Action Required?: Action required by provider: lab or test result follow-up needed.  Patient is followed in Pulmonology for OSA, last seen on 01/25/2024 by Neda Jennet LABOR, MD.  Called Nurse Triage reporting Results.  Triage Disposition: Call Specialist When Office Open  Patient/caregiver understands and will follow disposition?: Yes       Copied from CRM [id number]. Topic: Clinical - Lab/Test Results >> Jan 27, 2024  4:07 PM Chelsey Villa wrote: Reason for CRM: Patient calling to go over recent lab results. Reason for Disposition  Caller requesting lab results  (Exception: Routine or non-urgent lab result.)  Answer Assessment - Initial Assessment Questions 1. REASON FOR CALL or QUESTION: What is your reason for calling today? or How can I best     Caller requesting lab results as well as PFT results. Triager unable to locate PFT but relayed message from provider: [name], NP 01/26/2024 12:22 PM EDT   Please let patient know her labs were reassuring. BNP was normal (no indication for heart failure). Iron panel was normal. Hgb is baseline at 10.7   2. CALLER: Document the source of call. (e.g., laboratory staff, caregiver or patient).     patient  Protocols used: PCP Call - No Triage-[place]-AH

## 2024-01-27 NOTE — Telephone Encounter (Signed)
Pt requesting PFT results

## 2024-01-28 NOTE — Telephone Encounter (Signed)
 PFTs were consistent with asthma, take medications as directed and follow-up with MD

## 2024-01-28 NOTE — Progress Notes (Signed)
 ATC X2. LMTCB. I will mail a letter for pt to contact our office to discuss result notes. NFN

## 2024-01-28 NOTE — Progress Notes (Signed)
 ATC X2. LMTCB. I will mail a letter informing pt to contact our office to discuss result notes. NFN

## 2024-01-31 MED ORDER — FLUTICASONE FUROATE-VILANTEROL 100-25 MCG/ACT IN AEPB: 1.0000 | INHALATION_SPRAY | Freq: Every day | RESPIRATORY_TRACT | 3 refills | Status: DC

## 2024-01-31 NOTE — Telephone Encounter (Signed)
 Copied from CRM [id number]. Topic: Clinical - Lab/Test Results >> Jan 31, 2024  4:35 PM Shona S wrote: Reason for CRM: patient is calling to speak to someone in regards to xray results, please call at [phone]  [facility] with patient regarding prior message patient was given xray result's perfusion result's know VQ scan and CTA showed low probability PE. Advised patient Ranell was sent into patient's pharmacy .   Patient would like to talk to ashlyn

## 2024-01-31 NOTE — Addendum Note (Signed)
 Addended by: HOPE ALMARIE ORN on: 01/31/2024 12:40 PM   Modules accepted: Orders

## 2024-01-31 NOTE — Telephone Encounter (Signed)
 Pt.notified

## 2024-01-31 NOTE — Telephone Encounter (Signed)
 Please advise on medications as pt states she is not taking anything for asthma at the current moment

## 2024-01-31 NOTE — Telephone Encounter (Signed)
 Sorry about that I will send in an inhaler I want her to start, Breo Ellipta  100mcg take one puff daily in the morning and rinse  mouth after use

## 2024-02-07 ENCOUNTER — Ambulatory Visit: Payer: Self-pay

## 2024-02-07 NOTE — Telephone Encounter (Signed)
 Chelsey Villa, pt had PFT day after last visit with you (01/25/24) and is asking for these results. Please advise, thanks!

## 2024-02-07 NOTE — Telephone Encounter (Signed)
 FYI Only or Action Required?: Action required by provider: lab or test result follow-up needed.  Patient is followed in Pulmonology for hypoxia, last seen on 01/25/2024 by Neda Jennet LABOR, MD.  Called Nurse Triage reporting Results.   Triage Disposition: Call Specialist Today  Patient/caregiver understands and will follow disposition?: Yes     Copied from CRM [id number]. Topic: Clinical - Lab/Test Results >> Feb 07, 2024  1:43 PM Chelsey Villa wrote: Reason for CRM: pt calling for lab results Reason for Disposition  Caller requesting lab results  (Exception: Routine or non-urgent lab result.)  Answer Assessment - Initial Assessment Questions 1. REASON FOR CALL or QUESTION: What is your reason for calling today? or How can I best     Caregivers/pt calling to request most recent lab results/imaging. Triager reviewed notes from provider, but caregivers/pt have additional questions. Chelsey LELON Ferrari, NP 01/26/2024 12:22 PM EDT   Please let patient know her labs were reassuring. BNP was normal (no indication for heart failure). Iron panel was normal. Hgb is baseline at 10.7  Chelsey LELON Ferrari, NP 01/26/2024  2:57 PM EDT   Please let patient know CXR was normal, no acute cardipulmonary disease. Degenerative changes to spine   Caregivers/pt reports that they would like to F/U on PFT results as well. They report that they were advised that pt has asthma-- and have further questions. Triager attempted to schedule F/U appt, but no access.  2. CALLER: Document the source of call. (e.g., laboratory staff, caregiver or patient).     Caregivers/patient    Caregivers/pt have additional questions on PFT/lab results. Triager attempted to schedule F/U appt, but no access within preferred preference.   Triager will forward encounter for Ferrari, NP 's office to review and advise. Caregiver Patient verbalized understanding and is expecting call back from office for next steps/PFT results clarification.  Caregiver Patient verbalized understanding.  Protocols used: PCP Call - No Triage-A-AH

## 2024-02-08 DIAGNOSIS — Z79899 Other long term (current) drug therapy: Secondary | ICD-10-CM | POA: Diagnosis not present

## 2024-02-08 NOTE — Telephone Encounter (Signed)
 I believe the PFT results were relayed to her when she asked for them back on the 18th. They showed moderate obstructive lung diseased with reversibility, these are consistent with asthma. We sent in script for [facility] which she should used once daily. FU in 3 months recommended with any of our new pulmonary MDs

## 2024-02-08 NOTE — Telephone Encounter (Signed)
 ATC x1. Lmtcb

## 2024-02-09 ENCOUNTER — Telehealth: Payer: Self-pay

## 2024-02-09 NOTE — Telephone Encounter (Signed)
 Copied from CRM [id number]. Topic: Clinical - Lab/Test Results >> Feb 09, 2024  1:08 PM Rilla B wrote: Reason for CRM: Patient calling for [facility] Patient calling for PFT results.  Please return call to [phone].   ----------------------------------------------------------------------- From previous Reason for Contact - Other: Reason for CRM:    ----------------------------------------------------------------------- From previous Reason for Contact - Scheduling: Patient/patient representative is calling to schedule an appointment. Refer to attachments for appointment information.  Spoke with pt as well as her mother and father to discuss PFT results, which are consistent with asthma. Pt not using inhaler due to concerns of potential side affects, as well as pt's lack of sob/difficultybreathing/chestpain. Parents reported fluctuating O2 levels dropping as low as 88% without sob/difficultybreathing/chestpain. Parents want to know options for further testing for definitive asthma dx before pt begins inhaler use. Pt scheduled for follow up with [facility] 9/10. Pt and parents instructed to call back to our office with further questions. Pt and parents verbalized understanding.  Beth, please advise on further testing or if pt should wait until follow up with [facility]. Thank you.

## 2024-02-09 NOTE — Telephone Encounter (Signed)
 Spoke with patient and mother about further testing for definitive asthma dx. Instructed to discuss these tests with Dr. Annella at upcoming appointment 9/10. Pt denies chest pain, sob, or difficulty breathing but instructed to call office if symptoms arise. Pt and mother verbalized understanding. NFN.

## 2024-02-09 NOTE — Telephone Encounter (Signed)
 PFTs are consistent with obstructive asthma but we can order methacholine challenge at the hospital to definitely rule in/out asthma also order alpha-1 phenotype to assess for genetic deficiency which could contribute obstructive lung disease. Keep apt in September with Dr. Annella

## 2024-02-24 DIAGNOSIS — G4733 Obstructive sleep apnea (adult) (pediatric): Secondary | ICD-10-CM | POA: Diagnosis not present

## 2024-03-05 ENCOUNTER — Other Ambulatory Visit: Payer: Self-pay

## 2024-03-05 ENCOUNTER — Ambulatory Visit
Admission: RE | Admit: 2024-03-05 | Discharge: 2024-03-05 | Disposition: A | Discharge status: Home / Self Care | Payer: Self-pay | Source: Ambulatory Visit | Attending: Family Medicine | Admitting: Family Medicine

## 2024-03-05 VITALS — BP 148/99 | HR 115 | Temp 98.4°F | Resp 18

## 2024-03-05 DIAGNOSIS — H00033 Abscess of eyelid right eye, unspecified eyelid: Secondary | ICD-10-CM

## 2024-03-05 HISTORY — DX: Sleep apnea, unspecified: G47.30

## 2024-03-05 HISTORY — DX: Essential (primary) hypertension: I10

## 2024-03-05 MED ORDER — AMOXICILLIN-POT CLAVULANATE 875-125 MG PO TABS: 1.0000 | ORAL_TABLET | Freq: Two times a day (BID) | ORAL | 0 refills | Status: DC

## 2024-03-05 MED ORDER — ERYTHROMYCIN 5 MG/GM OP OINT: TOPICAL_OINTMENT | OPHTHALMIC | 0 refills | Status: AC

## 2024-03-05 NOTE — ED Provider Notes (Signed)
 [facility]    CSN: [id number] Arrival date & time: 03/05/24  9065      History   Chief Complaint Chief Complaint  Patient presents with   Eye Problem    Eyelid swollen. - Entered by patient    HPI Chelsey Villa is a 38 y.o. female with a past medical history of diabetes, hypertension, obesity, OSA, schizophrenia, tachycardia presents for eyelid swelling.  Patient reports 2 days of right upper eyelid swelling and redness and warmth that is worsening.  Denies drainage, fevers or chills.  Denies any known injury.  No eye pain or eye drainage.  Does not wear glasses or contacts.  She has tried colloidal silver drops without improvement.  No other concerns at this time.   Eye Problem   Past Medical History:  Diagnosis Date   Diabetes mellitus without complication (HCC)    Hemoglobin C (Hb-C) (HCC)    Hypertension    Obesity    OSA (obstructive sleep apnea)    Schizophrenia (HCC)    Sleep apnea in adult    Tachycardia    Vitamin D deficiency     Patient Active Problem List   Diagnosis Date Noted   Schizophrenia (HCC)    Obesity    Hemoglobin C (Hb-C) (HCC)    Vitamin D deficiency    OSA (obstructive sleep apnea)    Diabetes mellitus without complication (HCC)    Tachycardia     Past Surgical History:  Procedure Laterality Date   wisdomteeth  2003    OB History   No obstetric history on file.      Home Medications    Prior to Admission medications   Medication Sig Start Date End Date Taking? Authorizing Provider  amoxicillin -clavulanate (AUGMENTIN ) 875-125 MG tablet Take 1 tablet by mouth every 12 (twelve) hours. 03/05/24  Yes Loreda Myla SAUNDERS, NP  erythromycin  ophthalmic ointment Apply 3 times a day to the right upper eyelid 03/05/24  Yes Kathrynn Backstrom, Jodi R, NP  lurasidone (LATUDA) 80 MG TABS tablet Take 80 mg by mouth at bedtime. 10/26/23  Yes [provider]  amLODipine (NORVASC) 5 MG tablet Take 5 mg by mouth daily.    [provider]  Cholecalciferol (VITAMIN D3) 2000 UNITS TABS Take by mouth.    [provider]  clonazePAM (KLONOPIN) 0.5 MG tablet Take 0.5 mg by mouth 2 (two) times daily as needed for anxiety.    [provider]  cloZAPine (CLOZARIL) 100 MG tablet Take 100 mg by mouth daily.    [provider]  Evening Primrose Oil 500 MG CAPS Take by mouth.    [provider]  fluticasone  furoate-vilanterol (BREO ELLIPTA ) 100-25 MCG/ACT AEPB Inhale 1 puff into the lungs daily. 01/31/24   Hope Almarie ORN, NP  ibuprofen  (ADVIL ,MOTRIN ) 600 MG tablet Take 1 tablet (600 mg total) by mouth every 8 (eight) hours as needed. 04/30/15   Gretta Ozell CROME, PA-C  LATUDA 20 MG TABS tablet Take 10 mg by mouth 2 (two) times daily. 05/28/15   [provider]  LATUDA 60 MG TABS Take 1 tablet by mouth daily. 05/27/15   [provider]  Multiple Vitamin (MULTIVITAMIN) tablet Take 1 tablet by mouth daily.    [provider]  naproxen sodium (ANAPROX) 550 MG tablet Take 550 mg by mouth 2 (two) times daily with a meal.    [provider]  omega-3 acid ethyl esters (LOVAZA) 1 G capsule Take by mouth 2 (two) times daily.  [provider]  topiramate (TOPAMAX) 100 MG tablet Take 100 mg by mouth 2 (two) times daily.    [provider]  triamcinolone  (KENALOG ) 0.025 % cream Apply to affected area twice daily, rub into skin thoroughly, apply barrier cream such as Eucerin after triamcinolone  is completely absorbed. 06/23/21   Joesph Shaver Scales, PA-C    Family History History reviewed. No pertinent family history.  Social History Social History   Tobacco Use   Smoking status: Never   Smokeless tobacco: Never  Vaping Use   Vaping status: Never Used  Substance Use Topics   Alcohol use: Not Currently   Drug use: Not Currently     Allergies   Patient has no known allergies.   Review of Systems Review of Systems  Eyes:        Right upper  eyelid swelling     Physical Exam Triage Vital Signs ED Triage Vitals  Encounter Vitals Group     BP 03/05/24 0953 (!) 148/99     Girls Systolic BP Percentile --      Girls Diastolic BP Percentile --      Boys Systolic BP Percentile --      Boys Diastolic BP Percentile --      Pulse Rate 03/05/24 0953 (!) 115     Resp 03/05/24 0953 18     Temp 03/05/24 0953 98.4 F (36.9 C)     Temp Source 03/05/24 0953 Oral     SpO2 03/05/24 0953 92 %     Weight --      Height --      Head Circumference --      Peak Flow --      Pain Score 03/05/24 0949 0     Pain Loc --      Pain Education --      Exclude from Growth Chart --    No data found.  Updated Vital Signs BP (!) 148/99   Pulse (!) 115   Temp 98.4 F (36.9 C) (Oral)   Resp 18   LMP 02/28/2024   SpO2 92%   Visual Acuity Right Eye Distance:   Left Eye Distance:   Bilateral Distance:    Right Eye Near:   Left Eye Near:    Bilateral Near:     Physical Exam Vitals and nursing note reviewed.  Constitutional:      General: She is not in acute distress.    Appearance: Normal appearance. She is obese. She is not ill-appearing.  HENT:     Head: Normocephalic and atraumatic.  Eyes:     General:        Right eye: No foreign body, discharge or hordeolum.     Extraocular Movements: Extraocular movements intact.     Conjunctiva/sclera: Conjunctivae normal.     Right eye: Right conjunctiva is not injected. No chemosis, exudate or hemorrhage.    Pupils: Pupils are equal, round, and reactive to light.      Comments: There is moderate swelling with erythema and warmth of the right upper eyelid.  No periorbital swelling.  Cardiovascular:     Rate and Rhythm: Tachycardia present.     Comments: Heart rate 115, patient has history of tachycardia Pulmonary:     Effort: Pulmonary effort is normal.  Skin:    General: Skin is warm and dry.  Neurological:     General: No focal deficit present.     Mental Status: She is alert and  oriented to person,  place, and time.  Psychiatric:        Mood and Affect: Mood normal.        Behavior: Behavior normal.      UC Treatments / Results  Labs (all labs ordered are listed, but only abnormal results are displayed) Labs Reviewed - No data to display  EKG   Radiology No results found.  Procedures Procedures (including critical care time)  Medications Ordered in UC Medications - No data to display  Initial Impression / Assessment and Plan / UC Course  I have reviewed the triage vital signs and the nursing notes.  Pertinent labs & imaging results that were available during my care of the patient were reviewed by me and considered in my medical decision making (see chart for details).     Reviewed exam and symptoms with patient.  No visible stye.  Concern for eyelid cellulitis will start Augmentin  and erythromycin  ointment.  Advise PCP follow-up if symptoms do not improve.  ER precautions reviewed. Final Clinical Impressions(s) / UC Diagnoses   Final diagnoses:  Eyelid cellulitis, right     Discharge Instructions      Start the topical antibiotic 3 times a day for 7 days.  Also start the oral antibiotic Augmentin  twice daily for 7 days.  May do alternating cool and warm compresses to the eyelid as needed.  Please follow-up with your PCP if your symptoms do not improve.  Please go to the ER if you develop any worsening symptoms.  Nursing!    ED Prescriptions     Medication Sig Dispense Auth. Provider   erythromycin  ophthalmic ointment Apply 3 times a day to the right upper eyelid 3.5 g Aracelia Brinson, Jodi R, NP   amoxicillin -clavulanate (AUGMENTIN ) 875-125 MG tablet Take 1 tablet by mouth every 12 (twelve) hours. 14 tablet Stephane Junkins, Jodi R, NP      PDMP not reviewed this encounter.   Loreda Myla SAUNDERS, NP 03/05/24 1006

## 2024-03-05 NOTE — ED Triage Notes (Signed)
 Pt c/o right eyelid swelling and reddnessx2d. Pt has 2+ swelling of upper eyelid and it's erythematous. Pt denies vision issues.

## 2024-03-05 NOTE — Discharge Instructions (Addendum)
 Start the topical antibiotic 3 times a day for 7 days.  Also start the oral antibiotic Augmentin  twice daily for 7 days.  May do alternating cool and warm compresses to the eyelid as needed.  Please follow-up with your PCP if your symptoms do not improve.  Please go to the ER if you develop any worsening symptoms.  Nursing!

## 2024-03-07 DIAGNOSIS — Z79899 Other long term (current) drug therapy: Secondary | ICD-10-CM | POA: Diagnosis not present

## 2024-03-08 ENCOUNTER — Ambulatory Visit
Admission: RE | Admit: 2024-03-08 | Discharge: 2024-03-08 | Disposition: A | Discharge status: Home / Self Care | Source: Ambulatory Visit | Attending: Family Medicine | Admitting: Family Medicine

## 2024-03-08 VITALS — BP 124/86 | HR 111 | Temp 98.3°F | Resp 20

## 2024-03-08 DIAGNOSIS — H6122 Impacted cerumen, left ear: Secondary | ICD-10-CM

## 2024-03-08 NOTE — Discharge Instructions (Addendum)
Avoid Q-tips and earbuds Follow-up as needed

## 2024-03-08 NOTE — ED Provider Notes (Signed)
 [facility]    CSN: [id number] Arrival date & time: 03/08/24  1417      History   Chief Complaint Chief Complaint  Patient presents with   Ear Fullness    Entered by patient    HPI Chelsey Villa is a 38 y.o. female with a past medical history of diabetes, hypertension, OSA, schizophrenia, tachycardia presents for ear cleaning.  Patient reports some muffled hearing to her left ear and states she has has a history of cerumen impaction.  Does use Q-tips and wears earbuds.  Denies pain or drainage.  No other concerns at this time.   Ear Fullness    Past Medical History:  Diagnosis Date   Diabetes mellitus without complication (HCC)    Hemoglobin C (Hb-C) (HCC)    Hypertension    Obesity    OSA (obstructive sleep apnea)    Schizophrenia (HCC)    Sleep apnea in adult    Tachycardia    Vitamin D deficiency     Patient Active Problem List   Diagnosis Date Noted   Schizophrenia (HCC)    Obesity    Hemoglobin C (Hb-C) (HCC)    Vitamin D deficiency    OSA (obstructive sleep apnea)    Diabetes mellitus without complication (HCC)    Tachycardia     Past Surgical History:  Procedure Laterality Date   wisdomteeth  2003    OB History   No obstetric history on file.      Home Medications    Prior to Admission medications   Medication Sig Start Date End Date Taking? Authorizing Provider  amLODipine (NORVASC) 5 MG tablet Take 5 mg by mouth daily.    [provider]  amoxicillin -clavulanate (AUGMENTIN ) 875-125 MG tablet Take 1 tablet by mouth every 12 (twelve) hours. 03/05/24   Elleanor Guyett, Jodi R, NP  Cholecalciferol (VITAMIN D3) 2000 UNITS TABS Take by mouth.    [provider]  clonazePAM (KLONOPIN) 0.5 MG tablet Take 0.5 mg by mouth 2 (two) times daily as needed for anxiety.    [provider]  cloZAPine (CLOZARIL) 100 MG tablet Take 100 mg by mouth daily.    [provider]  erythromycin  ophthalmic ointment Apply 3 times  a day to the right upper eyelid 03/05/24   Sofya Moustafa, Jodi R, NP  Evening Primrose Oil 500 MG CAPS Take by mouth.    [provider]  fluticasone  furoate-vilanterol (BREO ELLIPTA ) 100-25 MCG/ACT AEPB Inhale 1 puff into the lungs daily. 01/31/24   Hope Almarie ORN, NP  ibuprofen  (ADVIL ,MOTRIN ) 600 MG tablet Take 1 tablet (600 mg total) by mouth every 8 (eight) hours as needed. 04/30/15   Gretta Ozell CROME, PA-C  LATUDA 20 MG TABS tablet Take 10 mg by mouth 2 (two) times daily. 05/28/15   [provider]  LATUDA 60 MG TABS Take 1 tablet by mouth daily. 05/27/15   [provider]  lurasidone (LATUDA) 80 MG TABS tablet Take 80 mg by mouth at bedtime. 10/26/23   [provider]  Multiple Vitamin (MULTIVITAMIN) tablet Take 1 tablet by mouth daily.    [provider]  naproxen sodium (ANAPROX) 550 MG tablet Take 550 mg by mouth 2 (two) times daily with a meal.    [provider]  omega-3 acid ethyl esters (LOVAZA) 1 G capsule Take by mouth 2 (two) times daily.    [provider]  topiramate (TOPAMAX) 100 MG tablet Take 100 mg by mouth 2 (two) times daily.  [provider]  triamcinolone  (KENALOG ) 0.025 % cream Apply to affected area twice daily, rub into skin thoroughly, apply barrier cream such as Eucerin after triamcinolone  is completely absorbed. 06/23/21   Joesph Shaver Scales, PA-C    Family History History reviewed. No pertinent family history.  Social History Social History   Tobacco Use   Smoking status: Never   Smokeless tobacco: Never  Vaping Use   Vaping status: Never Used  Substance Use Topics   Alcohol use: Not Currently   Drug use: Not Currently     Allergies   Patient has no known allergies.   Review of Systems Review of Systems  HENT:         Left ear clogged     Physical Exam Triage Vital Signs ED Triage Vitals [03/08/24 1435]  Encounter Vitals Group     BP 124/86     Girls Systolic BP  Percentile      Girls Diastolic BP Percentile      Boys Systolic BP Percentile      Boys Diastolic BP Percentile      Pulse Rate (!) 111     Resp 20     Temp 98.3 F (36.8 C)     Temp Source Oral     SpO2      Weight      Height      Head Circumference      Peak Flow      Pain Score 0     Pain Loc      Pain Education      Exclude from Growth Chart    No data found.  Updated Vital Signs BP 124/86 (BP Location: Left Arm)   Pulse (!) 111   Temp 98.3 F (36.8 C) (Oral)   Resp 20   LMP 02/28/2024 (Exact Date)   Visual Acuity Right Eye Distance:   Left Eye Distance:   Bilateral Distance:    Right Eye Near:   Left Eye Near:    Bilateral Near:     Physical Exam Vitals and nursing note reviewed.  Constitutional:      General: She is not in acute distress.    Appearance: Normal appearance. She is not ill-appearing.  HENT:     Head: Normocephalic and atraumatic.     Right Ear: Tympanic membrane and ear canal normal.     Left Ear: There is impacted cerumen.     Ears:     Comments: After irrigation left TM and canal within normal limits. Eyes:     Pupils: Pupils are equal, round, and reactive to light.  Cardiovascular:     Rate and Rhythm: Tachycardia present.     Comments: Patient has history of tachycardia Pulmonary:     Effort: Pulmonary effort is normal.  Skin:    General: Skin is warm and dry.  Neurological:     General: No focal deficit present.     Mental Status: She is alert and oriented to person, place, and time.  Psychiatric:        Mood and Affect: Mood normal.        Behavior: Behavior normal.      UC Treatments / Results  Labs (all labs ordered are listed, but only abnormal results are displayed) Labs Reviewed - No data to display  EKG   Radiology No results found.  Procedures Procedures (including critical care time)  Medications Ordered in UC Medications - No data to display  Initial Impression /  Assessment and Plan / UC Course   I have reviewed the triage vital signs and the nursing notes.  Pertinent labs & imaging results that were available during my care of the patient were reviewed by me and considered in my medical decision making (see chart for details).     Patient tolerated irrigation well.  Symptoms resolved after irrigation.  Advised to avoid Q-tips and earbuds.  Follow-up as needed. Final Clinical Impressions(s) / UC Diagnoses   Final diagnoses:  Impacted cerumen of left ear   Discharge Instructions   None    ED Prescriptions   None    PDMP not reviewed this encounter.   Loreda Myla SAUNDERS, NP 03/08/24 [phone]

## 2024-03-08 NOTE — ED Triage Notes (Signed)
 Pt present requesting to have her ears cleaned. States she does not have pain, discomfort or loss of hearing.

## 2024-03-09 DIAGNOSIS — G4733 Obstructive sleep apnea (adult) (pediatric): Secondary | ICD-10-CM | POA: Diagnosis not present

## 2024-03-22 ENCOUNTER — Encounter: Payer: Self-pay | Admitting: Pulmonary Disease

## 2024-03-22 ENCOUNTER — Ambulatory Visit (INDEPENDENT_AMBULATORY_CARE_PROVIDER_SITE_OTHER): Admitting: Pulmonary Disease

## 2024-03-22 VITALS — BP 122/72 | HR 114 | Ht 61.0 in | Wt 252.0 lb

## 2024-03-22 DIAGNOSIS — J4520 Mild intermittent asthma, uncomplicated: Secondary | ICD-10-CM

## 2024-03-22 DIAGNOSIS — G4733 Obstructive sleep apnea (adult) (pediatric): Secondary | ICD-10-CM | POA: Diagnosis not present

## 2024-03-22 DIAGNOSIS — R0902 Hypoxemia: Secondary | ICD-10-CM

## 2024-03-22 NOTE — Patient Instructions (Signed)
 Nice to meet you  No need for inhalers given otherwise doing fine without symptoms  I think asthma is a possibility based on your pulmonary function test but if symptoms are minimal to none no need for medications  Return to clinic as needed, please contact us  once you have had the CPAP machine for 31 days so that we can see you back in follow-up for sleep apnea and the CPAP machine

## 2024-03-22 NOTE — Progress Notes (Signed)
 @Patient  ID: Chelsey Villa, female    DOB: 02-24-1986, 38 y.o.   MRN: [id number]  Chief Complaint  Patient presents with   Medical Management of Chronic Issues    PT states O2 still drops     Referring provider: Teresa Channel, MD  HPI:   38 y.o. woman whom we are seeing to establish care with history of questionable hypoxemia at times.  PCP note reviewed.  Multiple prior pulmonary notes reviewed.  Reportedly desaturates oxygen at times.  Usually at rest.  When sitting still.  She was ambulate at last pulmonary visit did not desaturate.  Has not been documented low oxygen as far as I can tell in any healthcare setting.  She denies any dyspnea.  No cough.  No real symptoms.  She had PFTs performed 01/2024 that reveals positive bronchodilator sponsor the FEV1, mild restriction on lung volumes with severe low ERV, air trapping, normal DLCO.  Discussed at length air trapping and significant bronchodilator sponsor likely strong indicator of asthma.  She has no symptoms therefore no real treatment necessary.  She was prescribed Breo in the past she never used this.  She was afraid of side effects and denied any symptoms.  We discussed her low oxygen.  Likely related to OHS/OSA.  She was diagnosed with OSA.  Started CPAP.  Feels like it is helping.  Does not have follow-up visit for CPAP therapy scheduled yet.  Questionaires / Pulmonary Flowsheets:   ACT:      No data to display          MMRC:     No data to display          Epworth:      No data to display          Tests:   FENO:  No results found for: NITRICOXIDE  PFT:    Latest Ref Rng & Units 01/25/2024    3:41 PM  PFT Results  FVC-Pre L 1.76   FVC-Predicted Pre % 52   FVC-Post L 1.95   FVC-Predicted Post % 58   Pre FEV1/FVC % % 86   Post FEV1/FCV % % 88   FEV1-Pre L 1.50   FEV1-Predicted Pre % 54   FEV1-Post L 1.71   DLCO uncorrected ml/min/mmHg 17.83   DLCO UNC% % 90   DLCO corrected ml/min/mmHg  19.68   DLCO COR %Predicted % 99   DLVA Predicted % 163   TLC L 3.40   TLC % Predicted % 73   RV % Predicted % 99   Personally reviewed, spirometry suggestive of air trapping versus restriction, significant bronchodilator response with 210 cc and 13% increase in FEV1.  Lung volumes mildly reduced with severely reduced ERV, air trapping present, DLCO within normal limits -consistent with mild restriction related to habitus and air trapping and bronchodilator response suspected small airways disease or asthma  WALK:     01/24/2024   11:59 AM  SIX MIN WALK  Supplimental Oxygen during Test? (L/min) No  Tech Comments: Pt walked at a normal walking pace with no signs or complaints of s.o.b.    Imaging: Personally reviewed and as per EMR and discussion in this note No results found.  Lab Results: Personally reviewed CBC    Component Value Date/Time   WBC 8.0 01/24/2024 1109   RBC 4.42 01/24/2024 1109   HGB 10.7 (L) 01/24/2024 1109   HCT 31.5 (L) 01/24/2024 1109   PLT 279.0 01/24/2024 1109   MCV 71.3 (  L) 01/24/2024 1109   MCV 76.7 (A) 04/30/2015 1514   MCH 26.0 (A) 04/30/2015 1514   MCHC 34.1 01/24/2024 1109   RDW 21.1 (H) 01/24/2024 1109    BMET No results found for: NA, K, CL, CO2, GLUCOSE, BUN, CREATININE, CALCIUM, GFRNONAA, GFRAA  BNP No results found for: BNP  ProBNP    Component Value Date/Time   PROBNP 7.0 01/24/2024 1109    Specialty Problems       Pulmonary Problems   OSA (obstructive sleep apnea)    No Known Allergies  Immunization History  Administered Date(s) Administered   PFIZER Comirnaty(Gray Top)Covid-19 Tri-Sucrose Vaccine 12/13/2020, 05/02/2021   Pfizer(Comirnaty)Fall Seasonal Vaccine 12 years and older 07/24/2022, 03/19/2023    Past Medical History:  Diagnosis Date   Diabetes mellitus without complication (HCC)    Hemoglobin C (Hb-C) (HCC)    Hypertension    Obesity    OSA (obstructive sleep apnea)     Schizophrenia (HCC)    Sleep apnea in adult    Tachycardia    Vitamin D deficiency     Tobacco History: Social History   Tobacco Use  Smoking Status Never  Smokeless Tobacco Never   Counseling given: Not Answered   Continue to not smoke  Outpatient Encounter Medications as of 03/22/2024  Medication Sig   amLODipine (NORVASC) 5 MG tablet Take 5 mg by mouth daily.   Bioflavonoid Products (ESTER-C PO) Take 1,000 mg by mouth daily.   Cholecalciferol (VITAMIN D3) 2000 UNITS TABS Take by mouth.   clonazePAM (KLONOPIN) 0.5 MG tablet Take 0.5 mg by mouth 2 (two) times daily as needed for anxiety.   cloZAPine (CLOZARIL) 100 MG tablet Take 100 mg by mouth daily.   erythromycin  ophthalmic ointment Apply 3 times a day to the right upper eyelid   Evening Primrose Oil 500 MG CAPS Take by mouth.   ibuprofen  (ADVIL ,MOTRIN ) 600 MG tablet Take 1 tablet (600 mg total) by mouth every 8 (eight) hours as needed.   LATUDA 20 MG TABS tablet Take 10 mg by mouth daily.   lurasidone (LATUDA) 80 MG TABS tablet Take 80 mg by mouth at bedtime.   Misc Natural Products (BEET ROOT) 500 MG CAPS Take 1 capsule by mouth daily.   Multiple Vitamin (MULTIVITAMIN) tablet Take 1 tablet by mouth daily.   naproxen sodium (ANAPROX) 550 MG tablet Take 550 mg by mouth 2 (two) times daily with a meal.   omega-3 acid ethyl esters (LOVAZA) 1 G capsule Take by mouth 2 (two) times daily.   Oregano-Flaxseed Oil (OREGANO OIL-FLAXSEED OIL PO) Take 1 Capful by mouth daily.   triamcinolone  (KENALOG ) 0.025 % cream Apply to affected area twice daily, rub into skin thoroughly, apply barrier cream such as Eucerin after triamcinolone  is completely absorbed.   [DISCONTINUED] amoxicillin -clavulanate (AUGMENTIN ) 875-125 MG tablet Take 1 tablet by mouth every 12 (twelve) hours.   [DISCONTINUED] fluticasone  furoate-vilanterol (BREO ELLIPTA ) 100-25 MCG/ACT AEPB Inhale 1 puff into the lungs daily.   [DISCONTINUED] LATUDA 60 MG TABS Take 1 tablet  by mouth daily.   [DISCONTINUED] topiramate (TOPAMAX) 100 MG tablet Take 100 mg by mouth 2 (two) times daily.   No facility-administered encounter medications on file as of 03/22/2024.     Review of Systems  Review of Systems  No chest pain with exertion.  No orthopnea or PND.  Comprehensive review of systems otherwise negative. Physical Exam  BP 122/72   Pulse (!) 114   Ht 5' 1 (1.549 m)   Wt  252 lb (114.3 kg)   LMP 02/28/2024 (Exact Date)   SpO2 95%   BMI 47.61 kg/m   Wt Readings from Last 5 Encounters:  03/22/24 252 lb (114.3 kg)  01/24/24 245 lb 9.6 oz (111.4 kg)  01/18/24 246 lb (111.6 kg)  06/14/15 241 lb (109.3 kg)  04/30/15 240 lb 12.8 oz (109.2 kg)    BMI Readings from Last 5 Encounters:  03/22/24 47.61 kg/m  01/24/24 46.41 kg/m  01/18/24 46.48 kg/m  06/14/15 47.07 kg/m     Physical Exam General: Sitting in chair, no acute distress Eyes: EOMI, icterus Neck: Supple, no JVP Pulmonary: Distant, clear Cardiovascular: Warm, regular rhythm  Assessment & Plan:   Mild intermittent asthma: Most likely pulmonary diagnoses given significant bronchodilator response in FEV1 well as air trapping on PFTs 01/2024.  Otherwise normal PFT.  She is asymptomatic.  Denies significant dyspnea.  No role for medications at this time given lack of symptoms.  OSA on CPAP: Encouraged repeat visit 31 to 90 days after starting CPAP.  She has started CPAP and is clinically benefiting from this..  Encouraged her to schedule appointment with us  for CPAP compliance.  Reported hypoxemia: None documented in pulmonary clinic.  Occasionally was 88% when sitting in chair at home in the afternoons.  Morning oxygen levels better with OSA therapy.  Suspect OHS OSA shunt physiology via atelectasis.  CT scans clear.  DLCO within normal limits.  Improves with deep breaths and ambulation.  Encouraged to continue checking and if remains persistently low would need to document and prescribe oxygen but  currently there is no role for this.  I think this will improve with weight loss and activity.  Return if symptoms worsen or fail to improve.   Chelsey JONELLE Beals, MD 03/22/2024   This appointment required 42 minutes of patient care (this includes precharting, chart review, review of results, face-to-face care, etc.).

## 2024-03-26 DIAGNOSIS — G4733 Obstructive sleep apnea (adult) (pediatric): Secondary | ICD-10-CM | POA: Diagnosis not present

## 2024-03-29 DIAGNOSIS — G4733 Obstructive sleep apnea (adult) (pediatric): Secondary | ICD-10-CM | POA: Diagnosis not present

## 2024-04-05 DIAGNOSIS — I10 Essential (primary) hypertension: Secondary | ICD-10-CM | POA: Diagnosis not present

## 2024-04-05 DIAGNOSIS — G4730 Sleep apnea, unspecified: Secondary | ICD-10-CM | POA: Diagnosis not present

## 2024-04-05 DIAGNOSIS — E119 Type 2 diabetes mellitus without complications: Secondary | ICD-10-CM | POA: Diagnosis not present

## 2024-04-12 DIAGNOSIS — F200 Paranoid schizophrenia: Secondary | ICD-10-CM | POA: Diagnosis not present

## 2024-04-12 DIAGNOSIS — F411 Generalized anxiety disorder: Secondary | ICD-10-CM | POA: Diagnosis not present

## 2024-04-12 DIAGNOSIS — Z79899 Other long term (current) drug therapy: Secondary | ICD-10-CM | POA: Diagnosis not present

## 2024-04-12 DIAGNOSIS — R5383 Other fatigue: Secondary | ICD-10-CM | POA: Diagnosis not present

## 2024-04-24 ENCOUNTER — Encounter: Payer: Self-pay | Admitting: Pulmonary Disease

## 2024-04-24 ENCOUNTER — Ambulatory Visit: Admitting: Pulmonary Disease

## 2024-04-24 VITALS — BP 134/90 | HR 115 | Ht 61.0 in | Wt 254.0 lb

## 2024-04-24 DIAGNOSIS — G4733 Obstructive sleep apnea (adult) (pediatric): Secondary | ICD-10-CM

## 2024-04-24 NOTE — Progress Notes (Signed)
 @Patient  ID: Chelsey Villa, female    DOB: January 23, 1986, 38 y.o.   MRN: [id number]  Chief Complaint  Patient presents with   Medical Management of Chronic Issues    Pt states Bipap    Referring provider: Teresa Channel, MD  HPI:   38 y.o. woman whom we are seeing  with history of questionable hypoxemia at times.    Returns for routine follow-up.  Doing well.  No concerns.  Oxygen staying up.  Occasionally when sitting will drop to 88%.  But this is rare.  Has followed up with her sleep doctor in the interim.  BiPAP is helping her rest more.  HPI in this visit: Reportedly desaturates oxygen at times.  Usually at rest.  When sitting still.  She was ambulate at last pulmonary visit did not desaturate.  Has not been documented low oxygen as far as I can tell in any healthcare setting.  She denies any dyspnea.  No cough.  No real symptoms.  She had PFTs performed 01/2024 that reveals positive bronchodilator sponsor the FEV1, mild restriction on lung volumes with severe low ERV, air trapping, normal DLCO.  Discussed at length air trapping and significant bronchodilator sponsor likely strong indicator of asthma.  She has no symptoms therefore no real treatment necessary.  She was prescribed Breo in the past she never used this.  She was afraid of side effects and denied any symptoms.  We discussed her low oxygen.  Likely related to OHS/OSA.  She was diagnosed with OSA.  Started CPAP.  Feels like it is helping.  Does not have follow-up visit for CPAP therapy scheduled yet.  Questionaires / Pulmonary Flowsheets:   ACT:      No data to display          MMRC:     No data to display          Epworth:      No data to display          Tests:   FENO:  No results found for: NITRICOXIDE  PFT:    Latest Ref Rng & Units 01/25/2024    3:41 PM  PFT Results  FVC-Pre L 1.76   FVC-Predicted Pre % 52   FVC-Post L 1.95   FVC-Predicted Post % 58   Pre FEV1/FVC % % 86   Post  FEV1/FCV % % 88   FEV1-Pre L 1.50   FEV1-Predicted Pre % 54   FEV1-Post L 1.71   DLCO uncorrected ml/min/mmHg 17.83   DLCO UNC% % 90   DLCO corrected ml/min/mmHg 19.68   DLCO COR %Predicted % 99   DLVA Predicted % 163   TLC L 3.40   TLC % Predicted % 73   RV % Predicted % 99   Personally reviewed, spirometry suggestive of air trapping versus restriction, significant bronchodilator response with 210 cc and 13% increase in FEV1.  Lung volumes mildly reduced with severely reduced ERV, air trapping present, DLCO within normal limits -consistent with mild restriction related to habitus and air trapping and bronchodilator response suspected small airways disease or asthma  WALK:     01/24/2024   11:59 AM  SIX MIN WALK  Supplimental Oxygen during Test? (L/min) No  Tech Comments: Pt walked at a normal walking pace with no signs or complaints of s.o.b.    Imaging: Personally reviewed and as per EMR and discussion in this note No results found.  Lab Results: Personally reviewed CBC    Component Value  Date/Time   WBC 8.0 01/24/2024 1109   RBC 4.42 01/24/2024 1109   HGB 10.7 (L) 01/24/2024 1109   HCT 31.5 (L) 01/24/2024 1109   PLT 279.0 01/24/2024 1109   MCV 71.3 (L) 01/24/2024 1109   MCV 76.7 (A) 04/30/2015 1514   MCH 26.0 (A) 04/30/2015 1514   MCHC 34.1 01/24/2024 1109   RDW 21.1 (H) 01/24/2024 1109    BMET No results found for: NA, K, CL, CO2, GLUCOSE, BUN, CREATININE, CALCIUM, GFRNONAA, GFRAA  BNP No results found for: BNP  ProBNP    Component Value Date/Time   PROBNP 7.0 01/24/2024 1109    Specialty Problems       Pulmonary Problems   OSA (obstructive sleep apnea)    No Known Allergies  Immunization History  Administered Date(s) Administered   PFIZER Comirnaty(Gray Top)Covid-19 Tri-Sucrose Vaccine 12/13/2020, 05/02/2021    Past Medical History:  Diagnosis Date   Diabetes mellitus without complication (HCC)    Hemoglobin C (Hb-C)     Hypertension    Obesity    OSA (obstructive sleep apnea)    Schizophrenia (HCC)    Sleep apnea in adult    Tachycardia    Vitamin D deficiency     Tobacco History: Social History   Tobacco Use  Smoking Status Never  Smokeless Tobacco Never   Counseling given: Not Answered   Continue to not smoke  Outpatient Encounter Medications as of 04/24/2024  Medication Sig   amLODipine (NORVASC) 5 MG tablet Take 5 mg by mouth daily.   Bioflavonoid Products (ESTER-C PO) Take 1,000 mg by mouth daily.   Cholecalciferol (VITAMIN D3) 2000 UNITS TABS Take by mouth.   clonazePAM (KLONOPIN) 0.5 MG tablet Take 0.5 mg by mouth 2 (two) times daily as needed for anxiety.   cloZAPine (CLOZARIL) 100 MG tablet Take 100 mg by mouth daily.   erythromycin  ophthalmic ointment Apply 3 times a day to the right upper eyelid   Evening Primrose Oil 500 MG CAPS Take by mouth.   ibuprofen  (ADVIL ,MOTRIN ) 600 MG tablet Take 1 tablet (600 mg total) by mouth every 8 (eight) hours as needed.   LATUDA 20 MG TABS tablet Take 10 mg by mouth daily.   lurasidone (LATUDA) 80 MG TABS tablet Take 80 mg by mouth at bedtime.   Misc Natural Products (BEET ROOT) 500 MG CAPS Take 1 capsule by mouth daily.   Multiple Vitamin (MULTIVITAMIN) tablet Take 1 tablet by mouth daily.   naproxen sodium (ANAPROX) 550 MG tablet Take 550 mg by mouth 2 (two) times daily with a meal.   omega-3 acid ethyl esters (LOVAZA) 1 G capsule Take by mouth 2 (two) times daily.   Oregano-Flaxseed Oil (OREGANO OIL-FLAXSEED OIL PO) Take 1 Capful by mouth daily.   triamcinolone  (KENALOG ) 0.025 % cream Apply to affected area twice daily, rub into skin thoroughly, apply barrier cream such as Eucerin after triamcinolone  is completely absorbed.   No facility-administered encounter medications on file as of 04/24/2024.     Review of Systems  Review of Systems  N/a Physical Exam  BP (!) 134/90   Pulse (!) 115   Ht 5' 1 (1.549 m)   Wt 254 lb (115.2  kg)   SpO2 95%   BMI 47.99 kg/m   Wt Readings from Last 5 Encounters:  04/24/24 254 lb (115.2 kg)  03/22/24 252 lb (114.3 kg)  01/24/24 245 lb 9.6 oz (111.4 kg)  01/18/24 246 lb (111.6 kg)  06/14/15 241 lb (109.3 kg)  BMI Readings from Last 5 Encounters:  04/24/24 47.99 kg/m  03/22/24 47.61 kg/m  01/24/24 46.41 kg/m  01/18/24 46.48 kg/m  06/14/15 47.07 kg/m     Physical Exam General: Sitting in chair, no acute distress Eyes: EOMI, icterus Neck: Supple, no JVP Pulmonary: Distant, clear Cardiovascular: Warm, regular rhythm  Assessment & Plan:   Mild intermittent asthma: Most likely pulmonary diagnoses given significant bronchodilator response in FEV1 well as air trapping on PFTs 01/2024.  Otherwise normal PFT.  She is asymptomatic.  Denies significant dyspnea.  No role for medications at this time given lack of symptoms.  OSA on CPAP: Following with [facility].  Benefiting from BiPAP use.  She is encouraged to do this, continue adherence continue follow-up.  Reported hypoxemia: None documented in pulmonary clinic.  Occasionally was 88% when sitting in chair at home in the afternoons.  Morning oxygen levels better with OSA therapy.  Suspect OHS OSA shunt physiology via atelectasis.  CT scans clear.  DLCO within normal limits.  Improves with deep breaths and ambulation.  Encouraged to continue checking and if remains persistently low would need to document and prescribe oxygen but currently there is no role for this.  I think this will improve with weight loss and activity.  No follow-ups on file.   Donnice JONELLE Beals, MD 04/24/2024

## 2024-04-26 ENCOUNTER — Other Ambulatory Visit: Payer: Self-pay | Admitting: Family Medicine

## 2024-04-26 DIAGNOSIS — Z1231 Encounter for screening mammogram for malignant neoplasm of breast: Secondary | ICD-10-CM

## 2024-05-04 NOTE — Progress Notes (Signed)
 Pt has already been in office and this has been handled since this last note. NFN

## 2024-05-24 ENCOUNTER — Ambulatory Visit
Admission: RE | Admit: 2024-05-24 | Discharge: 2024-05-24 | Disposition: A | Discharge status: Home / Self Care | Source: Ambulatory Visit | Attending: Family Medicine | Admitting: Family Medicine

## 2024-05-24 DIAGNOSIS — Z1231 Encounter for screening mammogram for malignant neoplasm of breast: Secondary | ICD-10-CM

## 2024-05-28 ENCOUNTER — Ambulatory Visit: Payer: Self-pay

## 2024-05-30 DIAGNOSIS — Z79899 Other long term (current) drug therapy: Secondary | ICD-10-CM | POA: Diagnosis not present

## 2024-06-02 DIAGNOSIS — G4733 Obstructive sleep apnea (adult) (pediatric): Secondary | ICD-10-CM | POA: Diagnosis not present

## 2024-06-10 ENCOUNTER — Telehealth: Payer: Self-pay

## 2024-06-10 ENCOUNTER — Ambulatory Visit
Admission: RE | Admit: 2024-06-10 | Discharge: 2024-06-10 | Disposition: A | Discharge status: Home / Self Care | Attending: Physician Assistant

## 2024-06-10 VITALS — BP 138/85 | HR 107 | Temp 98.5°F | Resp 16

## 2024-06-10 DIAGNOSIS — R42 Dizziness and giddiness: Secondary | ICD-10-CM | POA: Diagnosis not present

## 2024-06-10 DIAGNOSIS — H6122 Impacted cerumen, left ear: Secondary | ICD-10-CM | POA: Diagnosis not present

## 2024-06-10 DIAGNOSIS — E119 Type 2 diabetes mellitus without complications: Secondary | ICD-10-CM | POA: Diagnosis not present

## 2024-06-10 DIAGNOSIS — R112 Nausea with vomiting, unspecified: Secondary | ICD-10-CM

## 2024-06-10 LAB — GLUCOSE, POCT (MANUAL RESULT ENTRY): POC Glucose: 106 mg/dL — AB (ref 70–99)

## 2024-06-10 MED ORDER — MECLIZINE HCL 25 MG PO TABS: 25.0000 mg | ORAL_TABLET | Freq: Three times a day (TID) | ORAL | 0 refills | Status: DC | PRN

## 2024-06-10 MED ORDER — MECLIZINE HCL 25 MG PO TABS: 25.0000 mg | ORAL_TABLET | Freq: Three times a day (TID) | ORAL | 0 refills | Status: AC | PRN

## 2024-06-10 MED ORDER — ONDANSETRON 4 MG PO TBDP: 4.0000 mg | ORAL_TABLET | Freq: Three times a day (TID) | ORAL | 0 refills | Status: AC | PRN

## 2024-06-10 MED ORDER — ONDANSETRON 4 MG PO TBDP: 4.0000 mg | ORAL_TABLET | Freq: Three times a day (TID) | ORAL | 0 refills | Status: DC | PRN

## 2024-06-10 MED ORDER — ONDANSETRON 4 MG PO TBDP
4.0000 mg | ORAL_TABLET | Freq: Once | ORAL | Status: AC
  Administered 2024-06-10: 4 mg via ORAL

## 2024-06-10 NOTE — Discharge Instructions (Addendum)
 VISIT SUMMARY:  You came in today because you have been experiencing dizziness and vomiting for the past couple of days. The dizziness usually happens before the vomiting and gets worse when you change positions, like moving from sitting to standing. You have not had any head injuries, falls, or loss of consciousness. There are no other symptoms like fever, nasal congestion, or shortness of breath.  YOUR PLAN:  -VERTIGO WITH ASSOCIATED NAUSEA AND VOMITING: Vertigo is a condition where you feel like you or your surroundings are spinning. It can cause dizziness and nausea. We believe your vertigo might be due to an imbalance in your inner ear. We have prescribed Meclizine to help with the dizziness and Zofran to manage the nausea and vomiting. Be aware that Meclizine can make you drowsy. We also recommend you perform the Epley maneuver at home to help with potential inner ear issues. If your symptoms continue, you may need vestibular rehabilitation. We checked your blood pressure and ordered blood work to rule out other causes like anemia or electrolyte imbalances. Watch for signs of dehydration or severe symptoms and seek emergency care if needed.  -IMPACTED CERUMEN: Impacted cerumen means you have a significant amount of earwax blocking your ear canal. This can make it hard to see your eardrum but is unlikely to be the main cause of your vertigo. You had earwax removed a couple of months ago, and it may need to be done again.We were able to successfully remove the earwax and your eardrum and ear canal look good.   INSTRUCTIONS:  Follow up with us  if your symptoms persist or worsen. Perform the Epley maneuver at home as instructed. Watch for signs of dehydration or severe symptoms and seek emergency care if needed. We will review your blood work once the results are available.

## 2024-06-10 NOTE — ED Triage Notes (Signed)
 Pt states dizziness that made her have some N/V since this am,states it is worse when she moves her head.

## 2024-06-10 NOTE — ED Provider Notes (Signed)
 [facility]    CSN: [id number] Arrival date & time: 06/10/24  1417      History   Chief Complaint Chief Complaint  Patient presents with  . Dizziness    HPI Chelsey Villa is a 38 y.o. female.  has a past medical history of Diabetes mellitus without complication (HCC), Hemoglobin C (Hb-C), Hypertension, Obesity, OSA (obstructive sleep apnea), Schizophrenia (HCC), Sleep apnea in adult, Tachycardia, and Vitamin D deficiency.   HPI  Discussed the use of AI scribe software for clinical note transcription with the patient, who gave verbal consent to proceed.      Past Medical History:  Diagnosis Date  . Diabetes mellitus without complication (HCC)   . Hemoglobin C (Hb-C)   . Hypertension   . Obesity   . OSA (obstructive sleep apnea)   . Schizophrenia (HCC)   . Sleep apnea in adult   . Tachycardia   . Vitamin D deficiency     Patient Active Problem List   Diagnosis Date Noted  . Schizophrenia (HCC)   . Obesity   . Hemoglobin C (Hb-C)   . Vitamin D deficiency   . OSA (obstructive sleep apnea)   . Diabetes mellitus without complication (HCC)   . Tachycardia     Past Surgical History:  Procedure Laterality Date  . wisdomteeth  2003    OB History   No obstetric history on file.      Home Medications    Prior to Admission medications   Medication Sig Start Date End Date Taking? Authorizing Provider  meclizine (ANTIVERT) 25 MG tablet Take 1 tablet (25 mg total) by mouth 3 (three) times daily as needed for dizziness. 06/10/24  Yes Monike Bragdon E, PA-C  ondansetron (ZOFRAN-ODT) 4 MG disintegrating tablet Take 1 tablet (4 mg total) by mouth every 8 (eight) hours as needed for nausea or vomiting. 06/10/24  Yes Nadea Kirkland E, PA-C  amLODipine (NORVASC) 5 MG tablet Take 5 mg by mouth daily.    [provider]  Bioflavonoid Products (ESTER-C PO) Take 1,000 mg by mouth daily.    [provider]  Cholecalciferol (VITAMIN D3) 2000  UNITS TABS Take by mouth.    [provider]  clonazePAM (KLONOPIN) 0.5 MG tablet Take 0.5 mg by mouth 2 (two) times daily as needed for anxiety.    [provider]  cloZAPine (CLOZARIL) 100 MG tablet Take 100 mg by mouth daily.    [provider]  erythromycin  ophthalmic ointment Apply 3 times a day to the right upper eyelid 03/05/24   Mayer, Jodi R, NP  Evening Primrose Oil 500 MG CAPS Take by mouth.    [provider]  ibuprofen  (ADVIL ,MOTRIN ) 600 MG tablet Take 1 tablet (600 mg total) by mouth every 8 (eight) hours as needed. 04/30/15   Gretta Ozell CROME, PA-C  LATUDA 20 MG TABS tablet Take 10 mg by mouth daily. 05/28/15   [provider]  lurasidone (LATUDA) 80 MG TABS tablet Take 80 mg by mouth at bedtime. 10/26/23   [provider]  Misc Natural Products (BEET ROOT) 500 MG CAPS Take 1 capsule by mouth daily.    [provider]  Multiple Vitamin (MULTIVITAMIN) tablet Take 1 tablet by mouth daily.    [provider]  naproxen sodium (ANAPROX) 550 MG tablet Take 550 mg by mouth 2 (two) times daily with a meal.    [provider]  omega-3 acid ethyl esters (LOVAZA) 1 G capsule Take by mouth  2 (two) times daily.    [provider]  Oregano-Flaxseed Oil (OREGANO OIL-FLAXSEED OIL PO) Take 1 Capful by mouth daily.    [provider]  triamcinolone  (KENALOG ) 0.025 % cream Apply to affected area twice daily, rub into skin thoroughly, apply barrier cream such as Eucerin after triamcinolone  is completely absorbed. 06/23/21   Joesph Shaver Scales, PA-C    Family History History reviewed. No pertinent family history.  Social History Social History   Tobacco Use  . Smoking status: Never  . Smokeless tobacco: Never  Vaping Use  . Vaping status: Never Used  Substance Use Topics  . Alcohol use: Not Currently  . Drug use: Not Currently     Allergies   Patient has no known allergies.   Review of  Systems Review of Systems  Constitutional:  Positive for chills. Negative for fever.  HENT:  Negative for congestion, ear discharge, ear pain, rhinorrhea and sore throat.   Eyes:  Negative for photophobia and visual disturbance.  Respiratory:  Negative for cough, choking and shortness of breath.   Gastrointestinal:  Positive for nausea and vomiting.  Neurological:  Positive for dizziness. Negative for headaches.     Physical Exam Triage Vital Signs ED Triage Vitals  Encounter Vitals Group     BP 06/10/24 1432 138/85     Girls Systolic BP Percentile --      Girls Diastolic BP Percentile --      Boys Systolic BP Percentile --      Boys Diastolic BP Percentile --      Pulse Rate 06/10/24 1432 (!) 107     Resp 06/10/24 1432 16     Temp 06/10/24 1432 98.5 F (36.9 C)     Temp Source 06/10/24 1432 Oral     SpO2 06/10/24 1432 95 %     Weight --      Height --      Head Circumference --      Peak Flow --      Pain Score 06/10/24 1431 0     Pain Loc --      Pain Education --      Exclude from Growth Chart --    Orthostatic VS for the past 24 hrs:  BP- Lying Pulse- Lying BP- Sitting Pulse- Sitting BP- Standing at 0 minutes Pulse- Standing at 0 minutes  06/10/24 1550 138/87 92 134/84 100 143/89 107    Updated Vital Signs BP 138/85 (BP Location: Right Arm)   Pulse (!) 107   Temp 98.5 F (36.9 C) (Oral)   Resp 16   LMP 06/05/2024 (Approximate)   SpO2 98%   Visual Acuity Right Eye Distance:   Left Eye Distance:   Bilateral Distance:    Right Eye Near:   Left Eye Near:    Bilateral Near:     Physical Exam Vitals reviewed.  Constitutional:      General: She is awake. She is not in acute distress.    Appearance: Normal appearance. She is well-developed and well-groomed. She is not ill-appearing, toxic-appearing or diaphoretic.  HENT:     Head: Normocephalic and atraumatic.     Right Ear: Hearing, tympanic membrane and ear canal normal.     Left Ear: Hearing, tympanic  membrane and ear canal normal.     Mouth/Throat:     Lips: Pink.     Mouth: Mucous membranes are moist.     Pharynx: Oropharynx is clear. Uvula midline. No pharyngeal swelling, oropharyngeal exudate, posterior  oropharyngeal erythema, uvula swelling or postnasal drip.  Cardiovascular:     Rate and Rhythm: Normal rate and regular rhythm.     Pulses: Normal pulses.          Radial pulses are 2+ on the right side and 2+ on the left side.     Heart sounds: Normal heart sounds. No murmur heard.    No friction rub. No gallop.  Pulmonary:     Effort: Pulmonary effort is normal.     Breath sounds: Normal breath sounds. No decreased air movement. No decreased breath sounds, wheezing, rhonchi or rales.  Musculoskeletal:     Cervical back: Normal range of motion and neck supple.  Lymphadenopathy:     Head:     Right side of head: No submental, submandibular or preauricular adenopathy.     Left side of head: No submental, submandibular or preauricular adenopathy.     Cervical:     Right cervical: No superficial cervical adenopathy.    Left cervical: No superficial cervical adenopathy.     Upper Body:     Right upper body: No supraclavicular adenopathy.     Left upper body: No supraclavicular adenopathy.  Neurological:     General: No focal deficit present.     Mental Status: She is alert and oriented to person, place, and time.  Psychiatric:        Mood and Affect: Mood normal.        Behavior: Behavior normal. Behavior is cooperative.        Thought Content: Thought content normal.        Judgment: Judgment normal.      UC Treatments / Results  Labs (all labs ordered are listed, but only abnormal results are displayed) Labs Reviewed  GLUCOSE, POCT (MANUAL RESULT ENTRY) - Abnormal; Notable for the following components:      Result Value   POC Glucose 106 (*)    All other components within normal limits  CBC WITH DIFFERENTIAL/PLATELET  COMPREHENSIVE METABOLIC PANEL WITH GFR     EKG   Radiology No results found.  Procedures Procedures (including critical care time)  Medications Ordered in UC Medications  ondansetron (ZOFRAN-ODT) disintegrating tablet 4 mg (has no administration in time range)    Initial Impression / Assessment and Plan / UC Course  I have reviewed the triage vital signs and the nursing notes.  Pertinent labs & imaging results that were available during my care of the patient were reviewed by me and considered in my medical decision making (see chart for details).      Final Clinical Impressions(s) / UC Diagnoses   Final diagnoses:  Diabetes mellitus without complication (HCC)  Dizziness and giddiness  Nausea and vomiting, unspecified vomiting type     Discharge Instructions      VISIT SUMMARY:  You came in today because you have been experiencing dizziness and vomiting for the past couple of days. The dizziness usually happens before the vomiting and gets worse when you change positions, like moving from sitting to standing. You have not had any head injuries, falls, or loss of consciousness. There are no other symptoms like fever, nasal congestion, or shortness of breath.  YOUR PLAN:  -VERTIGO WITH ASSOCIATED NAUSEA AND VOMITING: Vertigo is a condition where you feel like you or your surroundings are spinning. It can cause dizziness and nausea. We believe your vertigo might be due to an imbalance in your inner ear. We have prescribed Meclizine to help with the  dizziness and Zofran  to manage the nausea and vomiting. Be aware that Meclizine  can make you drowsy. We also recommend you perform the Epley maneuver at home to help with potential inner ear issues. If your symptoms continue, you may need vestibular rehabilitation. We checked your blood pressure and ordered blood work to rule out other causes like anemia or electrolyte imbalances. Watch for signs of dehydration or severe symptoms and seek emergency care if  needed.  -IMPACTED CERUMEN: Impacted cerumen means you have a significant amount of earwax blocking your ear canal. This can make it hard to see your eardrum but is unlikely to be the main cause of your vertigo. You had earwax removed a couple of months ago, and it may need to be done again.We were able to successfully remove the earwax and your eardrum and ear canal look good.   INSTRUCTIONS:  Follow up with us  if your symptoms persist or worsen. Perform the Epley maneuver at home as instructed. Watch for signs of dehydration or severe symptoms and seek emergency care if needed. We will review your blood work once the results are available.     ED Prescriptions     Medication Sig Dispense Auth. Provider   meclizine  (ANTIVERT ) 25 MG tablet Take 1 tablet (25 mg total) by mouth 3 (three) times daily as needed for dizziness. 30 tablet Tevion Laforge E, PA-C   ondansetron  (ZOFRAN -ODT) 4 MG disintegrating tablet Take 1 tablet (4 mg total) by mouth every 8 (eight) hours as needed for nausea or vomiting. 20 tablet Ayden Hardwick E, PA-C      PDMP not reviewed this encounter.

## 2024-06-11 ENCOUNTER — Ambulatory Visit: Payer: Self-pay | Admitting: Physician Assistant

## 2024-06-11 LAB — CBC WITH DIFFERENTIAL/PLATELET
Basophils Absolute: 0 x10E3/uL (ref 0.0–0.2)
Basos: 0 %
EOS (ABSOLUTE): 0 x10E3/uL (ref 0.0–0.4)
Eos: 0 %
Hematocrit: 28.8 % — ABNORMAL LOW (ref 34.0–46.6)
Hemoglobin: 9.2 g/dL — ABNORMAL LOW (ref 11.1–15.9)
Immature Grans (Abs): 0 x10E3/uL (ref 0.0–0.1)
Immature Granulocytes: 0 %
Lymphocytes Absolute: 1.3 x10E3/uL (ref 0.7–3.1)
Lymphs: 13 %
MCH: 25.7 pg — ABNORMAL LOW (ref 26.6–33.0)
MCHC: 31.9 g/dL (ref 31.5–35.7)
MCV: 80 fL (ref 79–97)
Monocytes Absolute: 0.3 x10E3/uL (ref 0.1–0.9)
Monocytes: 3 %
Neutrophils Absolute: 9 x10E3/uL — ABNORMAL HIGH (ref 1.4–7.0)
Neutrophils: 84 %
Platelets: 306 x10E3/uL (ref 150–450)
RBC: 3.58 x10E6/uL — ABNORMAL LOW (ref 3.77–5.28)
RDW: 18.1 % — ABNORMAL HIGH (ref 11.7–15.4)
WBC: 10.7 x10E3/uL (ref 3.4–10.8)

## 2024-06-11 LAB — COMPREHENSIVE METABOLIC PANEL WITH GFR
ALT: 22 IU/L (ref 0–32)
AST: 18 IU/L (ref 0–40)
Albumin: 4.8 g/dL (ref 3.9–4.9)
Alkaline Phosphatase: 60 IU/L (ref 41–116)
BUN/Creatinine Ratio: 8 — ABNORMAL LOW (ref 9–23)
BUN: 7 mg/dL (ref 6–24)
Bilirubin Total: 0.4 mg/dL (ref 0.0–1.2)
CO2: 22 mmol/L (ref 20–29)
Calcium: 9.5 mg/dL (ref 8.7–10.2)
Chloride: 102 mmol/L (ref 96–106)
Creatinine, Ser: 0.85 mg/dL (ref 0.57–1.00)
Globulin, Total: 2.6 g/dL (ref 1.5–4.5)
Glucose: 96 mg/dL (ref 70–99)
Potassium: 4 mmol/L (ref 3.5–5.2)
Sodium: 141 mmol/L (ref 134–144)
Total Protein: 7.4 g/dL (ref 6.0–8.5)
eGFR: 89 mL/min/1.73 (ref >59)

## 2024-06-11 NOTE — Progress Notes (Signed)
 Pt appears to have mild anemia which may be contributing to her dizziness. Recommend follow up with her PCP for further workup to determine underlying cause or going to ER for severe symptoms. CMP was largely normal
